# Patient Record
Sex: Female | Born: 1960
Health system: Southern US, Community
[De-identification: ages and names within clinical notes are randomized; demographics above are authoritative.]

## PROBLEM LIST (undated history)

## (undated) DIAGNOSIS — E7800 Pure hypercholesterolemia, unspecified: Secondary | ICD-10-CM

## (undated) DIAGNOSIS — M1990 Unspecified osteoarthritis, unspecified site: Secondary | ICD-10-CM

## (undated) HISTORY — PX: ABDOMINAL HYSTERECTOMY: SHX81

## (undated) HISTORY — PX: BREAST BIOPSY: SHX20

---

## 2005-05-23 ENCOUNTER — Ambulatory Visit: Payer: Self-pay

## 2006-07-02 ENCOUNTER — Ambulatory Visit: Payer: Self-pay

## 2008-08-02 ENCOUNTER — Ambulatory Visit: Payer: Self-pay

## 2009-03-24 ENCOUNTER — Emergency Department: Payer: Self-pay | Admitting: Emergency Medicine

## 2009-03-27 ENCOUNTER — Ambulatory Visit: Payer: Self-pay | Admitting: General Surgery

## 2009-07-27 ENCOUNTER — Ambulatory Visit: Payer: Self-pay | Admitting: Internal Medicine

## 2010-09-18 ENCOUNTER — Ambulatory Visit: Payer: Self-pay | Admitting: Family Medicine

## 2010-09-22 ENCOUNTER — Encounter: Payer: Self-pay | Admitting: Neurosurgery

## 2011-03-12 ENCOUNTER — Ambulatory Visit: Payer: Self-pay

## 2011-03-14 ENCOUNTER — Ambulatory Visit: Payer: Self-pay

## 2011-04-07 ENCOUNTER — Ambulatory Visit: Payer: Self-pay | Admitting: General Surgery

## 2011-06-11 ENCOUNTER — Ambulatory Visit: Payer: Self-pay

## 2011-11-13 ENCOUNTER — Ambulatory Visit: Payer: Self-pay | Admitting: General Surgery

## 2011-12-19 ENCOUNTER — Ambulatory Visit: Payer: Self-pay | Admitting: General Surgery

## 2011-12-23 LAB — PATHOLOGY REPORT

## 2012-10-29 ENCOUNTER — Other Ambulatory Visit: Payer: Self-pay

## 2012-10-29 LAB — CBC WITH DIFFERENTIAL/PLATELET
Basophil #: 0.1 10*3/uL (ref 0.0–0.1)
Basophil %: 1 %
Eosinophil #: 0.4 10*3/uL (ref 0.0–0.7)
HGB: 13.4 g/dL (ref 12.0–16.0)
Lymphocyte #: 2.7 10*3/uL (ref 1.0–3.6)
Lymphocyte %: 34.8 %
MCV: 89 fL (ref 80–100)
Monocyte #: 0.5 x10 3/mm (ref 0.2–0.9)
Monocyte %: 6.3 %
Neutrophil #: 4.1 10*3/uL (ref 1.4–6.5)
Neutrophil %: 53.3 %
RBC: 4.46 10*6/uL (ref 3.80–5.20)

## 2013-04-11 ENCOUNTER — Ambulatory Visit: Payer: Self-pay

## 2013-06-18 ENCOUNTER — Emergency Department: Payer: Self-pay | Admitting: Internal Medicine

## 2014-05-11 ENCOUNTER — Ambulatory Visit: Payer: Self-pay

## 2014-05-16 ENCOUNTER — Ambulatory Visit: Payer: Self-pay

## 2014-07-04 ENCOUNTER — Ambulatory Visit: Payer: Self-pay | Admitting: Physician Assistant

## 2014-07-04 LAB — OCCULT BLOOD X 1 CARD TO LAB, STOOL: Occult Blood, Feces: NEGATIVE

## 2014-07-04 LAB — CLOSTRIDIUM DIFFICILE(ARMC)

## 2014-07-06 LAB — STOOL CULTURE

## 2015-06-15 ENCOUNTER — Other Ambulatory Visit
Admit: 2015-06-15 | Discharge: 2015-06-15 | Disposition: A | Discharge status: Home / Self Care | Payer: 59 | Attending: Nurse Practitioner | Admitting: Nurse Practitioner

## 2015-06-15 DIAGNOSIS — Z0000 Encounter for general adult medical examination without abnormal findings: Secondary | ICD-10-CM | POA: Insufficient documentation

## 2015-06-15 LAB — COMPREHENSIVE METABOLIC PANEL WITH GFR
ALT: 24 U/L (ref 14–54)
AST: 21 U/L (ref 15–41)
Albumin: 4 g/dL (ref 3.5–5.0)
Alkaline Phosphatase: 97 U/L (ref 38–126)
Anion gap: 8 (ref 5–15)
BUN: 25 mg/dL — ABNORMAL HIGH (ref 6–20)
CO2: 26 mmol/L (ref 22–32)
Calcium: 9.3 mg/dL (ref 8.9–10.3)
Chloride: 106 mmol/L (ref 101–111)
Creatinine, Ser: 0.59 mg/dL (ref 0.44–1.00)
GFR calc Af Amer: >60 mL/min
GFR calc non Af Amer: >60 mL/min
Glucose, Bld: 87 mg/dL (ref 65–99)
Potassium: 3.7 mmol/L (ref 3.5–5.1)
Sodium: 140 mmol/L (ref 135–145)
Total Bilirubin: 0.4 mg/dL (ref 0.3–1.2)
Total Protein: 7.7 g/dL (ref 6.5–8.1)

## 2015-06-15 LAB — LIPID PANEL
Cholesterol: 246 mg/dL — ABNORMAL HIGH (ref 0–200)
HDL: 32 mg/dL — ABNORMAL LOW
LDL Cholesterol: UNDETERMINED mg/dL (ref 0–99)
Total CHOL/HDL Ratio: 7.7 ratio
Triglycerides: 734 mg/dL — ABNORMAL HIGH
VLDL: UNDETERMINED mg/dL (ref 0–40)

## 2015-06-15 LAB — CBC WITH DIFFERENTIAL/PLATELET
HEMATOCRIT: 39.7 % (ref 35.0–47.0)
HEMOGLOBIN: 13.2 g/dL (ref 12.0–16.0)
MCH: 29.8 pg (ref 26.0–34.0)
MCHC: 33.3 g/dL (ref 32.0–36.0)
MCV: 89.5 fL (ref 80.0–100.0)
Platelets: 264 10*3/uL (ref 150–440)
RBC: 4.44 MIL/uL (ref 3.80–5.20)
RDW: 15.7 % — AB (ref 11.5–14.5)
WBC: 9.1 10*3/uL (ref 3.6–11.0)

## 2015-06-15 LAB — T4, FREE: Free T4: 0.77 ng/dL (ref 0.61–1.12)

## 2015-06-15 LAB — TSH: TSH: 1.928 u[IU]/mL (ref 0.350–4.500)

## 2015-06-16 LAB — VITAMIN D 25 HYDROXY (VIT D DEFICIENCY, FRACTURES): Vit D, 25-Hydroxy: 20.3 ng/mL — ABNORMAL LOW (ref 30.0–100.0)

## 2015-06-16 LAB — T3: T3, Total: 130 ng/dL (ref 71–180)

## 2015-06-18 ENCOUNTER — Other Ambulatory Visit: Payer: Self-pay | Admitting: Internal Medicine

## 2015-06-18 DIAGNOSIS — Z1231 Encounter for screening mammogram for malignant neoplasm of breast: Secondary | ICD-10-CM

## 2015-06-18 DIAGNOSIS — E2839 Other primary ovarian failure: Secondary | ICD-10-CM

## 2015-06-19 ENCOUNTER — Other Ambulatory Visit: Payer: Self-pay | Admitting: Internal Medicine

## 2015-06-19 DIAGNOSIS — R920 Mammographic microcalcification found on diagnostic imaging of breast: Secondary | ICD-10-CM

## 2015-07-12 ENCOUNTER — Ambulatory Visit
Admission: RE | Admit: 2015-07-12 | Discharge: 2015-07-12 | Disposition: A | Discharge status: Home / Self Care | Payer: 59 | Source: Ambulatory Visit | Attending: Internal Medicine | Admitting: Internal Medicine

## 2015-07-12 DIAGNOSIS — R920 Mammographic microcalcification found on diagnostic imaging of breast: Secondary | ICD-10-CM | POA: Insufficient documentation

## 2015-07-12 DIAGNOSIS — Z780 Asymptomatic menopausal state: Secondary | ICD-10-CM | POA: Diagnosis not present

## 2015-07-12 DIAGNOSIS — E2839 Other primary ovarian failure: Secondary | ICD-10-CM | POA: Insufficient documentation

## 2015-09-11 DIAGNOSIS — G5601 Carpal tunnel syndrome, right upper limb: Secondary | ICD-10-CM | POA: Diagnosis not present

## 2015-09-11 DIAGNOSIS — M0579 Rheumatoid arthritis with rheumatoid factor of multiple sites without organ or systems involvement: Secondary | ICD-10-CM | POA: Diagnosis not present

## 2015-10-04 DIAGNOSIS — F411 Generalized anxiety disorder: Secondary | ICD-10-CM | POA: Diagnosis not present

## 2015-10-04 DIAGNOSIS — F17210 Nicotine dependence, cigarettes, uncomplicated: Secondary | ICD-10-CM | POA: Diagnosis not present

## 2015-10-04 DIAGNOSIS — E785 Hyperlipidemia, unspecified: Secondary | ICD-10-CM | POA: Diagnosis not present

## 2015-10-04 DIAGNOSIS — K219 Gastro-esophageal reflux disease without esophagitis: Secondary | ICD-10-CM | POA: Diagnosis not present

## 2015-10-04 DIAGNOSIS — Z0001 Encounter for general adult medical examination with abnormal findings: Secondary | ICD-10-CM | POA: Diagnosis not present

## 2015-10-19 DIAGNOSIS — M0579 Rheumatoid arthritis with rheumatoid factor of multiple sites without organ or systems involvement: Secondary | ICD-10-CM | POA: Diagnosis not present

## 2015-10-26 DIAGNOSIS — M0579 Rheumatoid arthritis with rheumatoid factor of multiple sites without organ or systems involvement: Secondary | ICD-10-CM | POA: Diagnosis not present

## 2015-10-26 DIAGNOSIS — G5601 Carpal tunnel syndrome, right upper limb: Secondary | ICD-10-CM | POA: Diagnosis not present

## 2015-10-26 DIAGNOSIS — Z79899 Other long term (current) drug therapy: Secondary | ICD-10-CM | POA: Diagnosis not present

## 2015-10-31 DIAGNOSIS — G5601 Carpal tunnel syndrome, right upper limb: Secondary | ICD-10-CM | POA: Diagnosis not present

## 2016-01-07 DIAGNOSIS — G5601 Carpal tunnel syndrome, right upper limb: Secondary | ICD-10-CM | POA: Diagnosis not present

## 2016-02-04 DIAGNOSIS — E785 Hyperlipidemia, unspecified: Secondary | ICD-10-CM | POA: Diagnosis not present

## 2016-02-04 DIAGNOSIS — G4700 Insomnia, unspecified: Secondary | ICD-10-CM | POA: Diagnosis not present

## 2016-02-04 DIAGNOSIS — J069 Acute upper respiratory infection, unspecified: Secondary | ICD-10-CM | POA: Diagnosis not present

## 2016-02-04 DIAGNOSIS — F411 Generalized anxiety disorder: Secondary | ICD-10-CM | POA: Diagnosis not present

## 2016-04-24 DIAGNOSIS — M0579 Rheumatoid arthritis with rheumatoid factor of multiple sites without organ or systems involvement: Secondary | ICD-10-CM | POA: Diagnosis not present

## 2016-04-24 DIAGNOSIS — G5603 Carpal tunnel syndrome, bilateral upper limbs: Secondary | ICD-10-CM | POA: Diagnosis not present

## 2016-06-12 ENCOUNTER — Ambulatory Visit: Payer: Self-pay | Admitting: Physician Assistant

## 2016-06-12 ENCOUNTER — Encounter: Payer: Self-pay | Admitting: Physician Assistant

## 2016-06-12 VITALS — BP 160/100 | HR 80 | Temp 98.5°F

## 2016-06-12 DIAGNOSIS — R030 Elevated blood-pressure reading, without diagnosis of hypertension: Secondary | ICD-10-CM

## 2016-06-12 DIAGNOSIS — J069 Acute upper respiratory infection, unspecified: Secondary | ICD-10-CM

## 2016-06-12 MED ORDER — AZITHROMYCIN 250 MG PO TABS: ORAL_TABLET | ORAL | 0 refills | Status: DC

## 2016-06-12 MED ORDER — BENZONATATE 200 MG PO CAPS: 200.0000 mg | ORAL_CAPSULE | Freq: Two times a day (BID) | ORAL | 0 refills | Status: DC | PRN

## 2016-06-12 NOTE — Progress Notes (Signed)
S: C/o runny nose and congestion for 10 days, no fever, chills, cp/sob, v/d; mucus was green this am but clear throughout the day, cough is sporadic, has a lot of head congestion  Using otc meds: robitussin, delsym, dayquil and nyquil  O: PE: vitals wnl, nad, perrl eomi, normocephalic, tms dull, nasal mucosa red and swollen, throat injected, neck supple no lymph, lungs c t a, cv rrr, neuro intact  A:  Acute uri, elevated bp   P: drink fluids, continue regular meds , use otc meds of choice, return if not improving in 5 days, return earlier if worsening ,zpack, tessalon perls, recheck bp after being off of dayquil and nyquil for a few days, if still elevated return to clinic, smoking cessation discussed

## 2016-09-03 DIAGNOSIS — D509 Iron deficiency anemia, unspecified: Secondary | ICD-10-CM | POA: Diagnosis not present

## 2016-09-03 DIAGNOSIS — I10 Essential (primary) hypertension: Secondary | ICD-10-CM | POA: Diagnosis not present

## 2016-09-03 DIAGNOSIS — F411 Generalized anxiety disorder: Secondary | ICD-10-CM | POA: Diagnosis not present

## 2016-09-03 DIAGNOSIS — E782 Mixed hyperlipidemia: Secondary | ICD-10-CM | POA: Diagnosis not present

## 2016-09-03 DIAGNOSIS — F17210 Nicotine dependence, cigarettes, uncomplicated: Secondary | ICD-10-CM | POA: Diagnosis not present

## 2016-09-03 DIAGNOSIS — E559 Vitamin D deficiency, unspecified: Secondary | ICD-10-CM | POA: Diagnosis not present

## 2016-09-03 DIAGNOSIS — J019 Acute sinusitis, unspecified: Secondary | ICD-10-CM | POA: Diagnosis not present

## 2016-12-16 DIAGNOSIS — H524 Presbyopia: Secondary | ICD-10-CM | POA: Diagnosis not present

## 2017-01-07 DIAGNOSIS — Z0001 Encounter for general adult medical examination with abnormal findings: Secondary | ICD-10-CM | POA: Diagnosis not present

## 2017-01-07 DIAGNOSIS — F17210 Nicotine dependence, cigarettes, uncomplicated: Secondary | ICD-10-CM | POA: Diagnosis not present

## 2017-01-07 DIAGNOSIS — F411 Generalized anxiety disorder: Secondary | ICD-10-CM | POA: Diagnosis not present

## 2017-01-07 DIAGNOSIS — E785 Hyperlipidemia, unspecified: Secondary | ICD-10-CM | POA: Diagnosis not present

## 2017-01-15 DIAGNOSIS — M0579 Rheumatoid arthritis with rheumatoid factor of multiple sites without organ or systems involvement: Secondary | ICD-10-CM | POA: Diagnosis not present

## 2017-01-19 ENCOUNTER — Other Ambulatory Visit: Payer: Self-pay | Admitting: Nurse Practitioner

## 2017-01-19 DIAGNOSIS — R921 Mammographic calcification found on diagnostic imaging of breast: Secondary | ICD-10-CM

## 2017-01-19 DIAGNOSIS — G5603 Carpal tunnel syndrome, bilateral upper limbs: Secondary | ICD-10-CM | POA: Diagnosis not present

## 2017-01-19 DIAGNOSIS — M0579 Rheumatoid arthritis with rheumatoid factor of multiple sites without organ or systems involvement: Secondary | ICD-10-CM | POA: Diagnosis not present

## 2017-02-19 ENCOUNTER — Ambulatory Visit
Admission: RE | Admit: 2017-02-19 | Discharge: 2017-02-19 | Disposition: A | Discharge status: Home / Self Care | Payer: 59 | Source: Ambulatory Visit | Attending: Nurse Practitioner | Admitting: Nurse Practitioner

## 2017-02-19 ENCOUNTER — Other Ambulatory Visit: Payer: Self-pay | Admitting: Nurse Practitioner

## 2017-02-19 DIAGNOSIS — R921 Mammographic calcification found on diagnostic imaging of breast: Secondary | ICD-10-CM | POA: Diagnosis not present

## 2017-02-19 DIAGNOSIS — N6489 Other specified disorders of breast: Secondary | ICD-10-CM | POA: Diagnosis not present

## 2017-06-22 ENCOUNTER — Encounter: Payer: Self-pay | Admitting: Physician Assistant

## 2017-06-22 ENCOUNTER — Ambulatory Visit: Payer: Self-pay | Admitting: Physician Assistant

## 2017-06-22 VITALS — BP 142/90 | HR 98 | Temp 98.2°F

## 2017-06-22 DIAGNOSIS — J209 Acute bronchitis, unspecified: Secondary | ICD-10-CM

## 2017-06-22 MED ORDER — METHYLPREDNISOLONE 4 MG PO TBPK: ORAL_TABLET | ORAL | 0 refills | Status: DC

## 2017-06-22 MED ORDER — AZITHROMYCIN 250 MG PO TABS: ORAL_TABLET | ORAL | 0 refills | Status: DC

## 2017-06-22 MED ORDER — ALBUTEROL SULFATE HFA 108 (90 BASE) MCG/ACT IN AERS: 2.0000 | INHALATION_SPRAY | Freq: Four times a day (QID) | RESPIRATORY_TRACT | 0 refills | Status: DC | PRN

## 2017-06-22 NOTE — Progress Notes (Signed)
S: C/o cough and congestion with wheezing and chest pain, chest is sore from coughing, denies fever, chills, mostly cough is dry and hacking; keeping pt awake at night;  denies cardiac type chest pain or sob, v/d, abd pain, sx for about a week Remainder ros neg  O: vitals wnl, nad, tms clear, throat injected, neck supple no lymph, lungs c t a, cv rrr, neuro intact  A:  Acute bronchitis   P:  rx medication:  Zpack, albuterol, medrol dose pack; smoking cessation, use otc meds, tylenol or motrin as needed for fever/chills, return if not better in 3 -5 days, return earlier if worsening

## 2017-07-06 DIAGNOSIS — G4700 Insomnia, unspecified: Secondary | ICD-10-CM | POA: Diagnosis not present

## 2017-07-06 DIAGNOSIS — F17210 Nicotine dependence, cigarettes, uncomplicated: Secondary | ICD-10-CM | POA: Diagnosis not present

## 2017-07-06 DIAGNOSIS — F411 Generalized anxiety disorder: Secondary | ICD-10-CM | POA: Diagnosis not present

## 2017-07-06 DIAGNOSIS — E785 Hyperlipidemia, unspecified: Secondary | ICD-10-CM | POA: Diagnosis not present

## 2017-07-06 DIAGNOSIS — R030 Elevated blood-pressure reading, without diagnosis of hypertension: Secondary | ICD-10-CM | POA: Diagnosis not present

## 2017-07-20 DIAGNOSIS — M0579 Rheumatoid arthritis with rheumatoid factor of multiple sites without organ or systems involvement: Secondary | ICD-10-CM | POA: Diagnosis not present

## 2017-09-08 ENCOUNTER — Other Ambulatory Visit: Payer: Self-pay

## 2017-09-08 MED ORDER — OMEPRAZOLE 20 MG PO CPDR: 20.0000 mg | DELAYED_RELEASE_CAPSULE | Freq: Every day | ORAL | 6 refills | Status: DC

## 2017-10-05 ENCOUNTER — Other Ambulatory Visit: Payer: Self-pay

## 2017-10-05 MED ORDER — ROSUVASTATIN CALCIUM 5 MG PO TABS: 5.0000 mg | ORAL_TABLET | Freq: Every day | ORAL | 5 refills | Status: DC

## 2017-10-06 ENCOUNTER — Other Ambulatory Visit: Payer: Self-pay | Admitting: Nurse Practitioner

## 2017-10-06 ENCOUNTER — Other Ambulatory Visit: Payer: Self-pay

## 2017-10-06 DIAGNOSIS — F331 Major depressive disorder, recurrent, moderate: Secondary | ICD-10-CM

## 2017-10-06 MED ORDER — DESVENLAFAXINE ER 50 MG PO TB24
150.0000 mg | ORAL_TABLET | Freq: Every day | ORAL | 5 refills | Status: DC
Start: 2017-10-06 — End: 2018-07-20

## 2017-10-06 NOTE — Progress Notes (Signed)
Patient should be taking pristiq 150mg  daily. Sent in rx as 50mg  tablets, taking 3 tablets daily.

## 2017-10-09 ENCOUNTER — Other Ambulatory Visit: Payer: Self-pay

## 2017-10-09 ENCOUNTER — Encounter: Payer: Self-pay | Admitting: Emergency Medicine

## 2017-10-09 ENCOUNTER — Ambulatory Visit
Admission: EM | Admit: 2017-10-09 | Discharge: 2017-10-09 | Disposition: A | Discharge status: Home / Self Care | Payer: No Typology Code available for payment source | Attending: Family Medicine | Admitting: Family Medicine

## 2017-10-09 DIAGNOSIS — R05 Cough: Secondary | ICD-10-CM

## 2017-10-09 DIAGNOSIS — R059 Cough, unspecified: Secondary | ICD-10-CM

## 2017-10-09 DIAGNOSIS — J9801 Acute bronchospasm: Secondary | ICD-10-CM | POA: Diagnosis not present

## 2017-10-09 MED ORDER — AZITHROMYCIN 250 MG PO TABS: ORAL_TABLET | ORAL | 0 refills | Status: DC

## 2017-10-09 MED ORDER — IPRATROPIUM-ALBUTEROL 0.5-2.5 (3) MG/3ML IN SOLN
3.0000 mL | Freq: Once | RESPIRATORY_TRACT | Status: AC
  Administered 2017-10-09: 3 mL via RESPIRATORY_TRACT

## 2017-10-09 MED ORDER — PREDNISONE 20 MG PO TABS: ORAL_TABLET | ORAL | 0 refills | Status: DC

## 2017-10-09 NOTE — ED Triage Notes (Signed)
Patient reports Saturday and Sunday having fever.  Patient reports c/o cough, chest congestion and SOB for the past 5 days.

## 2017-10-09 NOTE — ED Provider Notes (Signed)
[facility]    CSN: [id number] Arrival date & time: 10/09/17  0808     History   Chief Complaint Chief Complaint  Patient presents with  . Cough    HPI Karen Castro is a 57 y.o. female.   The history is provided by the patient.  Cough  Associated symptoms: fever, rhinorrhea and wheezing   URI  Presenting symptoms: congestion, cough, fatigue, fever and rhinorrhea   Severity:  Moderate Onset quality:  Sudden Duration:  7 days Timing:  Constant Progression:  Worsening Chronicity:  New Relieved by:  Nothing Ineffective treatments:  Prescription medications Associated symptoms: wheezing   Risk factors: chronic respiratory disease, immunosuppression and sick contacts   Risk factors: not elderly, no chronic cardiac disease, no chronic kidney disease, no diabetes mellitus, no recent illness and no recent travel     History reviewed. No pertinent past medical history.  There are no active problems to display for this patient.   Past Surgical History:  Procedure Laterality Date  . ABDOMINAL HYSTERECTOMY    . BREAST BIOPSY Right    core- neg    OB History    No data available       Home Medications    Prior to Admission medications   Medication Sig Start Date End Date Taking? Authorizing Provider  albuterol (PROVENTIL HFA;VENTOLIN HFA) 108 (90 Base) MCG/ACT inhaler Inhale 2 puffs into the lungs every 6 (six) hours as needed for wheezing or shortness of breath. 06/22/17  Yes Fisher, Roselyn Bering, PA-C  cetirizine (ZYRTEC) 10 MG tablet Take 10 mg by mouth daily.   Yes [provider]  Desvenlafaxine ER (PRISTIQ) 50 MG TB24 Take 3 tablets (150 mg total) by mouth daily. 10/06/17  Yes Carlean Jews, NP  folic acid (FOLVITE) 1 MG tablet Take by mouth. 01/19/17  Yes [provider]  methotrexate (RHEUMATREX) 2.5 MG tablet  03/10/16  Yes [provider]  omeprazole (PRILOSEC) 20 MG capsule Take 1 capsule (20 mg total) by mouth daily.  09/08/17  Yes Boscia, Kathlynn Grate, NP  rosuvastatin (CRESTOR) 5 MG tablet Take 1 tablet (5 mg total) by mouth daily with supper. 10/05/17  Yes Boscia, Kathlynn Grate, NP  azithromycin (ZITHROMAX Z-PAK) 250 MG tablet 2 tabs po once day 1, then 1 tab po qd for next 4 days 10/09/17   Payton Mccallum, MD  benzonatate (TESSALON) 200 MG capsule Take 1 capsule (200 mg total) by mouth 2 (two) times daily as needed for cough. Patient not taking: Reported on 06/22/2017 06/12/16   Faythe Ghee, PA-C  methylPREDNISolone (MEDROL DOSEPAK) 4 MG TBPK tablet Take 6 pills on day one then decrease by 1 pill each day 06/22/17   Faythe Ghee, PA-C  predniSONE (DELTASONE) 20 MG tablet 3 tabs po qd x 2 days, then 2 tabs po qd x 3 days, then 1 tab po qd x 3 days, then half a tab po qd x 2 days. 10/09/17   Payton Mccallum, MD    Family History Family History  Problem Relation Age of Onset  . Breast cancer Maternal Grandmother 64    Social History Social History   Tobacco Use  . Smoking status: Current Every Day Smoker    Types: Cigarettes  . Smokeless tobacco: Never Used  Substance Use Topics  . Alcohol use: Yes  . Drug use: No     Allergies   Patient has no known allergies.   Review of Systems Review of Systems  Constitutional: Positive for fatigue and fever.  HENT: Positive for congestion and rhinorrhea.   Respiratory: Positive for cough and wheezing.      Physical Exam Triage Vital Signs ED Triage Vitals  Enc Vitals Group     BP 10/09/17 0826 (!) 167/95     Pulse Rate 10/09/17 0826 85     Resp 10/09/17 0826 18     Temp 10/09/17 0826 97.9 F (36.6 C)     Temp Source 10/09/17 0826 Oral     SpO2 10/09/17 0826 98 %     Weight 10/09/17 0822 230 lb (104.3 kg)     Height 10/09/17 0822 5\' 8"  (1.727 m)     Head Circumference --      Peak Flow --      Pain Score 10/09/17 0821 0     Pain Loc --      Pain Edu? --      Excl. in GC? --    No data found.  Updated Vital Signs BP (!) 167/95 (BP  Location: Left Arm)   Pulse 78   Temp 97.9 F (36.6 C) (Oral)   Resp 18   Ht 5\' 8"  (1.727 m)   Wt 230 lb (104.3 kg)   SpO2 98%   BMI 34.97 kg/m   Visual Acuity Right Eye Distance:   Left Eye Distance:   Bilateral Distance:    Right Eye Near:   Left Eye Near:    Bilateral Near:     Physical Exam  Constitutional: She appears well-developed and well-nourished. No distress.  HENT:  Head: Normocephalic and atraumatic.  Right Ear: Tympanic membrane, external ear and ear canal normal.  Left Ear: Tympanic membrane, external ear and ear canal normal.  Nose: Rhinorrhea present. No mucosal edema, nose lacerations, sinus tenderness, nasal deformity, septal deviation or nasal septal hematoma. No epistaxis.  No foreign bodies. Right sinus exhibits no maxillary sinus tenderness and no frontal sinus tenderness. Left sinus exhibits no maxillary sinus tenderness and no frontal sinus tenderness.  Mouth/Throat: Uvula is midline, oropharynx is clear and moist and mucous membranes are normal. No oropharyngeal exudate.  Eyes: Conjunctivae and EOM are normal. Pupils are equal, round, and reactive to light. Right eye exhibits no discharge. Left eye exhibits no discharge. No scleral icterus.  Neck: Normal range of motion. Neck supple. No thyromegaly present.  Cardiovascular: Normal rate, regular rhythm and normal heart sounds.  Pulmonary/Chest: Effort normal. No stridor. No respiratory distress. She has wheezes (diffusely and rhonchi). She has no rales.  Lymphadenopathy:    She has no cervical adenopathy.  Skin: She is not diaphoretic.  Nursing note and vitals reviewed.    UC Treatments / Results  Labs (all labs ordered are listed, but only abnormal results are displayed) Labs Reviewed - No data to display  EKG  EKG Interpretation None       Radiology No results found.  Procedures Procedures (including critical care time)  Medications Ordered in UC Medications  ipratropium-albuterol  (DUONEB) 0.5-2.5 (3) MG/3ML nebulizer solution 3 mL (3 mLs Nebulization Given 10/09/17 0847)     Initial Impression / Assessment and Plan / UC Course  I have reviewed the triage vital signs and the nursing notes.  Pertinent labs & imaging results that were available during my care of the patient were reviewed by me and considered in my medical decision making (see chart for details).       Final Clinical Impressions(s) / UC Diagnoses   Final diagnoses:  Cough  Bronchospasm    ED Discharge Orders        Ordered    azithromycin (ZITHROMAX Z-PAK) 250 MG tablet     10/09/17 0902    predniSONE (DELTASONE) 20 MG tablet     10/09/17 0902     1. diagnosis reviewed with patient; given duoneb tx in clinic 2. rx as per orders above; reviewed possible side effects, interactions, risks and benefits  3. Recommend supportive treatment with rest, fluids; continue current home inhaler 4. Follow-up prn if symptoms worsen or don't improve  Controlled Substance Prescriptions [place] Controlled Substance Registry consulted? Not Applicable   Payton Mccallum, MD 10/09/17 [id number]

## 2017-10-12 ENCOUNTER — Telehealth: Payer: Self-pay

## 2017-10-12 NOTE — Telephone Encounter (Signed)
Called to follow up with patient since visit here at [facility]. Patient instructed to call back with any questions or concerns. MAH  

## 2017-11-27 ENCOUNTER — Ambulatory Visit (INDEPENDENT_AMBULATORY_CARE_PROVIDER_SITE_OTHER): Payer: No Typology Code available for payment source

## 2017-11-27 ENCOUNTER — Ambulatory Visit
Admission: EM | Admit: 2017-11-27 | Discharge: 2017-11-27 | Disposition: A | Discharge status: Home / Self Care | Payer: No Typology Code available for payment source | Attending: Emergency Medicine | Admitting: Emergency Medicine

## 2017-11-27 ENCOUNTER — Other Ambulatory Visit: Payer: Self-pay

## 2017-11-27 ENCOUNTER — Encounter: Payer: Self-pay | Admitting: *Deleted

## 2017-11-27 DIAGNOSIS — W010XXA Fall on same level from slipping, tripping and stumbling without subsequent striking against object, initial encounter: Secondary | ICD-10-CM | POA: Diagnosis not present

## 2017-11-27 DIAGNOSIS — M25421 Effusion, right elbow: Secondary | ICD-10-CM | POA: Diagnosis not present

## 2017-11-27 DIAGNOSIS — M25521 Pain in right elbow: Secondary | ICD-10-CM | POA: Diagnosis not present

## 2017-11-27 HISTORY — DX: Pure hypercholesterolemia, unspecified: E78.00

## 2017-11-27 HISTORY — DX: Unspecified osteoarthritis, unspecified site: M19.90

## 2017-11-27 MED ORDER — OXYCODONE-ACETAMINOPHEN 5-325 MG PO TABS: 1.0000 | ORAL_TABLET | Freq: Three times a day (TID) | ORAL | 0 refills | Status: DC | PRN

## 2017-11-27 NOTE — ED Provider Notes (Signed)
[facility] ____________________________________________  Time seen: Approximately 8:49 PM  I have reviewed the triage vital signs and the nursing notes.   HISTORY  Chief Complaint Arm Injury   HPI Karen Castro is a 57 y.o. female presenting for evaluation of right elbow pain post mechanical injury that occurred approximately 2 hours prior to arrival.  Patient reports that they were helping get things together for a yard sale, turned around when her husband called her name, and tripped over a box of books causing her to fall forward.  States that she did hit the palm of her hand and unsure if she tried to catch herself with her right arm.  States right elbow pain since injury.  States that she does have an abrasion to her right knee, denies need for tetanus immunization and denies other pain to the knee.  Also reports did hit her glasses and slightly hit left face but denies loss of consciousness or other pain.  Denies facial pain. Denies headache, vision changes, dizziness.  Presenting for right elbow pain.  Reports right elbow pain has been constant since injury.  Described currently as minimal but worse with direct palpation and attempt at extending.  States unable to fully extend arm.  Reports right-hand dominant.  Did take Advil prior to arrival.  No other alleviating measures attempted.  Denies paresthesias.  Some pain radiation from elbow up and down arm, none constant.   Denies chest pain, shortness of breath, abdominal pain, headache, neck or back pain. Denies recent sickness.  Lyndon Code, MD: PCP   Past Medical History:  Diagnosis Date  . Arthritis   . Hypercholesteremia   RA (follows with [facility])    There are no active problems to display for this patient.   Past Surgical History:  Procedure Laterality Date  . ABDOMINAL HYSTERECTOMY    . BREAST BIOPSY Right    core- neg     No current facility-administered medications for this encounter.    Current Outpatient Medications:  .  albuterol (PROVENTIL HFA;VENTOLIN HFA) 108 (90 Base) MCG/ACT inhaler, Inhale 2 puffs into the lungs every 6 (six) hours as needed for wheezing or shortness of breath., Disp: 1 Inhaler, Rfl: 0 .  cetirizine (ZYRTEC) 10 MG tablet, Take 10 mg by mouth daily., Disp: , Rfl:  .  Desvenlafaxine ER (PRISTIQ) 50 MG TB24, Take 3 tablets (150 mg total) by mouth daily., Disp: 90 tablet, Rfl: 5 .  folic acid (FOLVITE) 1 MG tablet, Take by mouth., Disp: , Rfl:  .  methotrexate (RHEUMATREX) 2.5 MG tablet, , Disp: , Rfl: 6 .  omeprazole (PRILOSEC) 20 MG capsule, Take 1 capsule (20 mg total) by mouth daily., Disp: 30 capsule, Rfl: 6 .  rosuvastatin (CRESTOR) 5 MG tablet, Take 1 tablet (5 mg total) by mouth daily with supper., Disp: 30 tablet, Rfl: 5 .  oxyCODONE-acetaminophen (PERCOCET/ROXICET) 5-325 MG tablet, Take 1 tablet by mouth every 8 (eight) hours as needed for severe pain. Do not drive while taking as can cause drowsiness., Disp: 9 tablet, Rfl: 0  Allergies Patient has no known allergies.  Family History  Problem Relation Age of Onset  . Breast cancer Maternal Grandmother 79    Social History Social History   Tobacco Use  . Smoking status: Current Every Day Smoker    Types: Cigarettes  . Smokeless tobacco: Never Used  Substance Use Topics  . Alcohol use: Yes  . Drug use: No    Review of Systems Eyes: No  visual changes. Cardiovascular: Denies chest pain. Respiratory: Denies shortness of breath. Gastrointestinal: No abdominal pain.   Musculoskeletal: Negative for back pain. As above.  Skin: Negative for rash. Neurological: Negative for headaches, focal weakness or numbness. .  ____________________________________________   PHYSICAL EXAM:  VITAL SIGNS: ED Triage Vitals  Enc Vitals Group     BP 11/27/17 1930 (!) 177/80     Pulse Rate 11/27/17 1930 88     Resp 11/27/17 1930 16     Temp 11/27/17 1930 98.4 F (36.9 C)     Temp Source  11/27/17 1930 Oral     SpO2 11/27/17 1930 96 %     Weight --      Height --      Head Circumference --      Peak Flow --      Pain Score 11/27/17 1933 2     Pain Loc --      Pain Edu? --      Excl. in GC? --     Constitutional: Alert and oriented. Well appearing and in no acute distress. Eyes: Conjunctivae are normal.  ENT      Head: Normocephalic and atraumatic. Cardiovascular: Normal rate, regular rhythm. Grossly normal heart sounds.  Good peripheral circulation. Respiratory: Normal respiratory effort without tachypnea nor retractions. Breath sounds are clear and equal bilaterally. No wheezes, rales, rhonchi. Musculoskeletal: No midline cervical, thoracic or lumbar tenderness to palpation. Bilateral distal radial pulses equal and easily palpated. Except: right lateral epicondyle and proximal radial head mild to moderate tenderness to direct palpation with minimal swelling, unable to fully extend elbow, able to flex past 90 degrees,pain with pronation and supination, unable fully supinate, right upper extremity otherwise nontender.  Full range of motion present to right elbow and wrist and otherwise nontender.  Slightly weakened right hand grip versus left, but with normal right hand distal sensation and cap refill. Neurologic:  Normal speech and language. No gross focal neurologic deficits are appreciated. Speech is normal. No gait instability.  Skin:  Skin is warm, dry. Psychiatric: Mood and affect are normal. Speech and behavior are normal. Patient exhibits appropriate insight and judgment   ___________________________________________   LABS (all labs ordered are listed, but only abnormal results are displayed)  Labs Reviewed - No data to display ____________________________________________  RADIOLOGY  Dg Elbow Complete Right  Addendum Date: 11/27/2017   ADDENDUM REPORT: 11/27/2017 20:41 ADDENDUM: There is a posterior fat pad without a definitive fracture. If there is high  clinical suspicion for an occult fracture, recommend a follow-up x-ray of the right elbow in 7-10 days. Electronically Signed   By: Elige Ko   On: 11/27/2017 20:41   Result Date: 11/27/2017 CLINICAL DATA:  Status post fall.  Pain. EXAM: RIGHT ELBOW - COMPLETE 3+ VIEW COMPARISON:  None. FINDINGS: There is no evidence of fracture, dislocation, or joint effusion. There is no evidence of arthropathy or other focal bone abnormality. Soft tissues are unremarkable. IMPRESSION: Negative. Electronically Signed: By: Elige Ko On: 11/27/2017 20:21   ____________________________________________   PROCEDURES Procedures    INITIAL IMPRESSION / ASSESSMENT AND PLAN / ED COURSE  Pertinent labs & imaging results that were available during my care of the patient were reviewed by me and considered in my medical decision making (see chart for details).  Well-appearing patient.  No acute distress.  No focal neurological deficits.  Presenting for right elbow pain post mechanical injury that occurred present 2 hours prior to arrival.  Patient unable to  fully extend right elbow and with mechanism of injury, concern for fracture.  Right elbow x-ray ordered and reviewed reviewed by myself as well as radiologist.  Called and discussed with radiologist as well, due to concern of posterior into anterior fat pad present.  Concern for radial head fracture with associated fat pad.  Posterior OCL splint and sling applied and recommend for repeat imaging in 1 week.  Follow-up with orthopedics, information given.  Rx # 9 Percocet given.  [place] controlled substance database reviewed and no conflicting controlled substances recently documented.  Encouraged rest, ice and supportive care.  Sling also given.Discussed indication, risks and benefits of medications with patient.  Discussed follow up with Primary care physician this week. Discussed follow up and return parameters including no resolution or any worsening  concerns. Patient verbalized understanding and agreed to plan.   ____________________________________________   FINAL CLINICAL IMPRESSION(S) / ED DIAGNOSES  Final diagnoses:  Right elbow pain  Effusion of right elbow     ED Discharge Orders        Ordered    oxyCODONE-acetaminophen (PERCOCET/ROXICET) 5-325 MG tablet  Every 8 hours PRN     11/27/17 2105       Note: This dictation was prepared with Dragon dictation along with smaller phrase technology. Any transcriptional errors that result from this process are unintentional.         Renford Dills, NP 11/27/17 2200

## 2017-11-27 NOTE — ED Triage Notes (Signed)
Patient fell this afternoon when she tripped over a box, Patient injured right arm /  elbow during the fall.

## 2017-11-27 NOTE — Discharge Instructions (Signed)
Take medication as prescribed. Rest. Drink plenty of fluids. Keep in splint and sling.   Follow up with orthopedic in one week as discussed.   Follow up with your primary care physician this week as needed. Return to Urgent care for new or worsening concerns.

## 2018-01-12 ENCOUNTER — Ambulatory Visit (INDEPENDENT_AMBULATORY_CARE_PROVIDER_SITE_OTHER): Payer: No Typology Code available for payment source | Admitting: Nurse Practitioner

## 2018-01-12 ENCOUNTER — Encounter: Payer: Self-pay | Admitting: Nurse Practitioner

## 2018-01-12 VITALS — BP 153/74 | HR 82 | Resp 16 | Ht 68.0 in | Wt 240.2 lb

## 2018-01-12 DIAGNOSIS — R300 Dysuria: Secondary | ICD-10-CM | POA: Diagnosis not present

## 2018-01-12 DIAGNOSIS — M069 Rheumatoid arthritis, unspecified: Secondary | ICD-10-CM | POA: Diagnosis not present

## 2018-01-12 DIAGNOSIS — Z124 Encounter for screening for malignant neoplasm of cervix: Secondary | ICD-10-CM

## 2018-01-12 DIAGNOSIS — Z0001 Encounter for general adult medical examination with abnormal findings: Secondary | ICD-10-CM | POA: Diagnosis not present

## 2018-01-12 DIAGNOSIS — J069 Acute upper respiratory infection, unspecified: Secondary | ICD-10-CM | POA: Diagnosis not present

## 2018-01-12 DIAGNOSIS — F3341 Major depressive disorder, recurrent, in partial remission: Secondary | ICD-10-CM

## 2018-01-12 DIAGNOSIS — E782 Mixed hyperlipidemia: Secondary | ICD-10-CM | POA: Diagnosis not present

## 2018-01-12 MED ORDER — AZITHROMYCIN 250 MG PO TABS: ORAL_TABLET | ORAL | 0 refills | Status: DC

## 2018-01-12 NOTE — Progress Notes (Signed)
[facility] [address] [place], [place] [address]  Internal MEDICINE  Office Visit Note  Patient Name: Karen Castro  [id number]  [id number]  Date of Service: 01/14/2018   Pt is here for routine health maintenance examination  Chief Complaint  Patient presents with  . Annual Exam  . Gynecologic Exam    6 month  . Allergies  . Ankle Pain     Reporting ankle pain and swelling for past few weeks. Has been worse since weather has been warm and humid.  Has had cough for past few months. Got worse after allergies started. She is a smoker and she is not ready to quit at this time.     Current Medication: Outpatient Encounter Medications as of 01/12/2018  Medication Sig Note  . albuterol (PROVENTIL HFA;VENTOLIN HFA) 108 (90 Base) MCG/ACT inhaler Inhale 2 puffs into the lungs every 6 (six) hours as needed for wheezing or shortness of breath.   . cetirizine (ZYRTEC) 10 MG tablet Take 10 mg by mouth daily.   Marland Kitchen Desvenlafaxine ER (PRISTIQ) 50 MG TB24 Take 3 tablets (150 mg total) by mouth daily.   . folic acid (FOLVITE) 1 MG tablet Take by mouth.   . methotrexate (RHEUMATREX) 2.5 MG tablet  06/12/2016: Received from: External Pharmacy  . omeprazole (PRILOSEC) 20 MG capsule Take 1 capsule (20 mg total) by mouth daily.   Marland Kitchen oxyCODONE-acetaminophen (PERCOCET/ROXICET) 5-325 MG tablet Take 1 tablet by mouth every 8 (eight) hours as needed for severe pain. Do not drive while taking as can cause drowsiness.   . rosuvastatin (CRESTOR) 5 MG tablet Take 1 tablet (5 mg total) by mouth daily with supper.   Marland Kitchen azithromycin (ZITHROMAX) 250 MG tablet z-pack - take as directed for 5 days    No facility-administered encounter medications on file as of 01/12/2018.     Surgical History: Past Surgical History:  Procedure Laterality Date  . ABDOMINAL HYSTERECTOMY    . BREAST BIOPSY Right    core- neg    Medical History: Past Medical History:  Diagnosis Date  . Arthritis   .  Hypercholesteremia     Family History: Family History  Problem Relation Age of Onset  . Breast cancer Maternal Grandmother 74      Review of Systems  Constitutional: Positive for fatigue. Negative for activity change, appetite change, chills and fever.  HENT: Positive for congestion, postnasal drip, rhinorrhea, sinus pain and sneezing. Negative for ear pain, sore throat and voice change.   Eyes: Negative.   Respiratory: Positive for cough and wheezing.   Cardiovascular: Negative for chest pain and palpitations.       Elevated blood pressure  Gastrointestinal: Negative for abdominal pain, constipation, diarrhea, nausea and vomiting.  Endocrine: Negative for cold intolerance, heat intolerance, polydipsia, polyphagia and polyuria.  Genitourinary: Negative for dysuria, flank pain, frequency and urgency.  Musculoskeletal: Positive for arthralgias and myalgias.  Skin: Negative for rash.  Allergic/Immunologic: Positive for environmental allergies.  Neurological: Positive for headaches.  Hematological: Positive for adenopathy.  Psychiatric/Behavioral: Positive for dysphoric mood. The patient is nervous/anxious.        Well managed on current medications.      Today's Vitals   01/12/18 0947  BP: (!) 153/74  Pulse: 82  Resp: 16  SpO2: 99%  Weight: 240 lb 3.2 oz (109 kg)  Height: 5\' 8"  (1.727 m)    Physical Exam  Constitutional: She is oriented to person, place, and time. She appears well-developed and well-nourished. No  distress.  HENT:  Head: Normocephalic and atraumatic.  Nose: Rhinorrhea present. Right sinus exhibits frontal sinus tenderness. Left sinus exhibits frontal sinus tenderness.  Mouth/Throat: Oropharynx is clear and moist. No oropharyngeal exudate.  Eyes: Pupils are equal, round, and reactive to light. EOM are normal.  Neck: Normal range of motion. Neck supple. No JVD present. Carotid bruit is not present. No tracheal deviation present. No thyromegaly present.   Cardiovascular: Normal rate, regular rhythm, normal heart sounds and intact distal pulses. Exam reveals no gallop and no friction rub.  No murmur heard. Pulmonary/Chest: Effort normal. No respiratory distress. She has wheezes. She has no rales. She exhibits no tenderness. Right breast exhibits no inverted nipple, no mass, no nipple discharge, no skin change and no tenderness. Left breast exhibits no inverted nipple, no mass, no nipple discharge, no skin change and no tenderness.  Soft, expiratory wheezes. Congested, non-productive cough noted.   Abdominal: Soft. Bowel sounds are normal. There is no tenderness.  Genitourinary: Vagina normal and uterus normal.  Genitourinary Comments: No tenderness, masses, or organomegaly present during bimanual exam.  Musculoskeletal: Normal range of motion.  Lymphadenopathy:    She has cervical adenopathy.  Neurological: She is alert and oriented to person, place, and time. No cranial nerve deficit.  Skin: Skin is warm and dry. She is not diaphoretic.  Psychiatric: She has a normal mood and affect. Her behavior is normal. Judgment and thought content normal.  Nursing note and vitals reviewed.    LABS: Recent Results (from the past 2160 hour(s))  Urinalysis, Routine w reflex microscopic     Status: Abnormal   Collection Time: 01/12/18  2:13 PM  Result Value Ref Range   Specific Gravity, UA 1.025 1.005 - 1.030   pH, UA 5.0 5.0 - 7.5   Color, UA Yellow Yellow   Appearance Ur Clear Clear   Leukocytes, UA Negative Negative   Protein, UA Negative Negative/Trace   Glucose, UA Negative Negative   Ketones, UA Negative Negative   RBC, UA Trace (A) Negative   Bilirubin, UA Negative Negative   Urobilinogen, Ur 0.2 0.2 - 1.0 mg/dL   Nitrite, UA Negative Negative   Microscopic Examination See below:     Comment: Microscopic was indicated and was performed.  Microscopic Examination     Status: Abnormal   Collection Time: 01/12/18  2:13 PM  Result Value Ref  Range   WBC, UA 0-5 0 - 5 /hpf   RBC, UA 0-2 0 - 2 /hpf   Epithelial Cells (non renal) 0-10 0 - 10 /hpf   Casts None seen None seen /lpf   Crystals Present (A) N/A   Crystal Type Calcium Oxalate N/A   Mucus, UA Present Not Estab.   Bacteria, UA Few None seen/Few   Assessment/Plan: 1. Encounter for general adult medical examination with abnormal findings Annual health maintenance exam with pap smear today.   2. Acute upper respiratory infection z-pack - take as directed for 5 days. OTC medication should be used to alleviate symptoms.  - azithromycin (ZITHROMAX) 250 MG tablet; z-pack - take as directed for 5 days  Dispense: 6 tablet; Refill: 0  3. Mixed hyperlipidemia Check fasting lipid panel and adjust cholesterol medication as indicated.   4. Recurrent major depressive disorder, in partial remission (HCC) conitnue antidepressant therapy as prescribed. Symptoms very well managed  5. Rheumatoid arthritis, involving unspecified site, unspecified rheumatoid factor presence (HCC) Continue regular visits with Dr. Gavin Potters as scheduled   6. Routine cervical smear - Pap  IG and HPV (high risk) DNA detection  7. Dysuria - Urinalysis, Routine w reflex microscopic    General Counseling: Letitia Neri understanding of the findings of todays visit and agrees with plan of treatment. I have discussed any further diagnostic evaluation that may be needed or ordered today. We also reviewed her medications today. she has been encouraged to call the office with any questions or concerns that should arise related to todays visit.  Rest and increase fluids. Continue using OTC medication to control symptoms.   This patient was seen by Vincent Gros, FNP- C in Collaboration with Dr Lyndon Code as a part of collaborative care agreement   Orders Placed This Encounter  Procedures  . Microscopic Examination  . Urinalysis, Routine w reflex microscopic    Meds ordered this encounter   Medications  . azithromycin (ZITHROMAX) 250 MG tablet    Sig: z-pack - take as directed for 5 days    Dispense:  6 tablet    Refill:  0    Order Specific Question:   Supervising Provider    Answer:   Lyndon Code [[id number]]    Time spent: 25 Minutes   Lyndon Code, MD  Internal Medicine

## 2018-01-13 LAB — URINALYSIS, ROUTINE W REFLEX MICROSCOPIC
Bilirubin, UA: NEGATIVE
Glucose, UA: NEGATIVE
Ketones, UA: NEGATIVE
LEUKOCYTES UA: NEGATIVE
Nitrite, UA: NEGATIVE
PH UA: 5 (ref 5.0–7.5)
PROTEIN UA: NEGATIVE
Specific Gravity, UA: 1.025 (ref 1.005–1.030)
Urobilinogen, Ur: 0.2 mg/dL (ref 0.2–1.0)

## 2018-01-13 LAB — MICROSCOPIC EXAMINATION: CASTS: NONE SEEN /LPF

## 2018-01-14 DIAGNOSIS — F3341 Major depressive disorder, recurrent, in partial remission: Secondary | ICD-10-CM | POA: Insufficient documentation

## 2018-01-14 DIAGNOSIS — M069 Rheumatoid arthritis, unspecified: Secondary | ICD-10-CM | POA: Insufficient documentation

## 2018-01-14 DIAGNOSIS — Z1239 Encounter for other screening for malignant neoplasm of breast: Secondary | ICD-10-CM | POA: Insufficient documentation

## 2018-01-14 DIAGNOSIS — Z124 Encounter for screening for malignant neoplasm of cervix: Secondary | ICD-10-CM

## 2018-01-14 DIAGNOSIS — J069 Acute upper respiratory infection, unspecified: Secondary | ICD-10-CM | POA: Insufficient documentation

## 2018-01-14 DIAGNOSIS — R300 Dysuria: Secondary | ICD-10-CM | POA: Insufficient documentation

## 2018-01-14 DIAGNOSIS — E782 Mixed hyperlipidemia: Secondary | ICD-10-CM | POA: Insufficient documentation

## 2018-01-15 LAB — PAP IG AND HPV HIGH-RISK
HPV, high-risk: NEGATIVE
PAP SMEAR COMMENT: 0

## 2018-01-21 ENCOUNTER — Other Ambulatory Visit
Admission: RE | Admit: 2018-01-21 | Discharge: 2018-01-21 | Disposition: A | Discharge status: Home / Self Care | Payer: No Typology Code available for payment source | Source: Ambulatory Visit | Attending: Nurse Practitioner | Admitting: Nurse Practitioner

## 2018-01-21 DIAGNOSIS — I10 Essential (primary) hypertension: Secondary | ICD-10-CM | POA: Diagnosis present

## 2018-01-21 DIAGNOSIS — E7800 Pure hypercholesterolemia, unspecified: Secondary | ICD-10-CM | POA: Diagnosis not present

## 2018-01-21 LAB — COMPREHENSIVE METABOLIC PANEL
ALT: 20 U/L (ref 14–54)
ANION GAP: 7 (ref 5–15)
AST: 20 U/L (ref 15–41)
Albumin: 4 g/dL (ref 3.5–5.0)
Alkaline Phosphatase: 104 U/L (ref 38–126)
BILIRUBIN TOTAL: 0.5 mg/dL (ref 0.3–1.2)
BUN: 18 mg/dL (ref 6–20)
CO2: 26 mmol/L (ref 22–32)
Calcium: 9.2 mg/dL (ref 8.9–10.3)
Chloride: 106 mmol/L (ref 101–111)
Creatinine, Ser: 0.55 mg/dL (ref 0.44–1.00)
GFR calc Af Amer: >60 mL/min (ref >60)
Glucose, Bld: 105 mg/dL — ABNORMAL HIGH (ref 65–99)
POTASSIUM: 3.7 mmol/L (ref 3.5–5.1)
Sodium: 139 mmol/L (ref 135–145)
TOTAL PROTEIN: 7.7 g/dL (ref 6.5–8.1)

## 2018-01-21 LAB — LIPID PANEL
CHOL/HDL RATIO: 4.6 ratio
CHOLESTEROL: 176 mg/dL (ref 0–200)
HDL: 38 mg/dL — AB (ref >40)
LDL Cholesterol: 76 mg/dL (ref 0–99)
Triglycerides: 309 mg/dL — ABNORMAL HIGH (ref <150)
VLDL: 62 mg/dL — ABNORMAL HIGH (ref 0–40)

## 2018-01-21 LAB — TSH: TSH: 1.326 u[IU]/mL (ref 0.350–4.500)

## 2018-01-21 LAB — T4, FREE: FREE T4: 0.67 ng/dL — AB (ref 0.82–1.77)

## 2018-01-22 NOTE — Progress Notes (Signed)
Pt was notified./kp

## 2018-01-22 NOTE — Progress Notes (Signed)
Pt was notified.Karen Castro

## 2018-03-11 ENCOUNTER — Encounter: Payer: Self-pay | Admitting: Pharmacist

## 2018-03-11 ENCOUNTER — Ambulatory Visit (INDEPENDENT_AMBULATORY_CARE_PROVIDER_SITE_OTHER): Payer: No Typology Code available for payment source | Admitting: Pharmacist

## 2018-03-11 ENCOUNTER — Encounter: Payer: No Typology Code available for payment source | Admitting: Pharmacist

## 2018-03-11 DIAGNOSIS — Z79899 Other long term (current) drug therapy: Secondary | ICD-10-CM

## 2018-03-11 MED ORDER — ADALIMUMAB 40 MG/0.4ML ~~LOC~~ AJKT: 40.0000 mg | AUTO-INJECTOR | SUBCUTANEOUS | 6 refills | Status: DC

## 2018-03-11 MED FILL — HUMIRA PEN 40 MG/0.4ML PNKT: 40 | 28 days supply | Qty: 2 | Fill #0

## 2018-03-11 NOTE — Progress Notes (Signed)
   S: [facility].  Patient is currently prescribed Humira for rheumatoid arthritis but she has not started it yet. She has read through a packet of information on Humira. Patient is managed by Dr. Gavin Potters for this.   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  Dosing:  Rheumatoid arthritis: SubQ: 40 mg every other week (may continue methotrexate, other nonbiologic DMARDS, corticosteroids, NSAIDs, and/or analgesics); patients not taking concomitant methotrexate may increase dose to 40 mg every week  Dose adjustments: Renal: no dose adjustments (has not been studied) Hepatic: no dose adjustments (has not been studied)  Drug-drug interactions: none  Screening: TB test: completed per patient Hepatitis: completed per patient  Monitoring: S/sx of infection: denies CBC: regularly monitored by Dr. Gavin Potters (patient has been on methotrexate) S/sx of hypersensitivity: has not started S/sx of malignancy: denies S/sx of heart failure: denies  O:     Lab Results  Component Value Date   WBC 9.1 06/15/2015   HGB 13.2 06/15/2015   HCT 39.7 06/15/2015   MCV 89.5 06/15/2015   PLT 264 06/15/2015      Chemistry      Component Value Date/Time   NA 139 01/21/2018 1131   K 3.7 01/21/2018 1131   CL 106 01/21/2018 1131   CO2 26 01/21/2018 1131   BUN 18 01/21/2018 1131   CREATININE 0.55 01/21/2018 1131      Component Value Date/Time   CALCIUM 9.2 01/21/2018 1131   ALKPHOS 104 01/21/2018 1131   AST 20 01/21/2018 1131   ALT 20 01/21/2018 1131   BILITOT 0.5 01/21/2018 1131       A/P: 1. Medication review: Patient currently on Humira for rheumatoid arthritis and has not started it yet. Reviewed the medication with the patient, including the following: Humira is a TNF blocking agent indicated for ankylosing spondylitis, Crohn's disease,  Hidradenitis suppurativa, psoriatic arthritis, plaque psoriasis, ulcerative colitis, and uveitis. Patient educated on purpose, proper use and potential adverse effects of Humira. The most common adverse effects are infections, headache, and injection site reactions. There is the possibility of an increased risk of malignancy but it is not well understood if this increased risk is due to there medication or the disease state. There are rare cases of pancytopenia and aplastic anemia. No recommendations for any changes at this time.   Alvino Blood, PharmD, BCPS, BCACP, CPP Clinical Pharmacist Practitioner  [phone]

## 2018-04-07 ENCOUNTER — Other Ambulatory Visit: Payer: Self-pay

## 2018-04-07 MED ORDER — OMEPRAZOLE 20 MG PO CPDR: 20.0000 mg | DELAYED_RELEASE_CAPSULE | Freq: Every day | ORAL | 6 refills | Status: DC

## 2018-04-07 MED ORDER — ROSUVASTATIN CALCIUM 5 MG PO TABS: 5.0000 mg | ORAL_TABLET | Freq: Every day | ORAL | 5 refills | Status: DC

## 2018-04-07 MED FILL — HUMIRA PEN 40 MG/0.4ML PNKT: 40 | 28 days supply | Qty: 2 | Fill #1

## 2018-05-13 ENCOUNTER — Encounter: Payer: Self-pay | Admitting: Adult Health

## 2018-05-13 ENCOUNTER — Ambulatory Visit (INDEPENDENT_AMBULATORY_CARE_PROVIDER_SITE_OTHER): Payer: No Typology Code available for payment source | Admitting: Adult Health

## 2018-05-13 VITALS — BP 146/89 | HR 90 | Temp 98.0°F | Resp 16 | Ht 68.0 in | Wt 237.0 lb

## 2018-05-13 DIAGNOSIS — R05 Cough: Secondary | ICD-10-CM

## 2018-05-13 DIAGNOSIS — J40 Bronchitis, not specified as acute or chronic: Secondary | ICD-10-CM | POA: Diagnosis not present

## 2018-05-13 DIAGNOSIS — J069 Acute upper respiratory infection, unspecified: Secondary | ICD-10-CM | POA: Diagnosis not present

## 2018-05-13 DIAGNOSIS — R0989 Other specified symptoms and signs involving the circulatory and respiratory systems: Secondary | ICD-10-CM

## 2018-05-13 DIAGNOSIS — R058 Other specified cough: Secondary | ICD-10-CM

## 2018-05-13 MED ORDER — PREDNISONE 10 MG PO TABS: ORAL_TABLET | ORAL | 0 refills | Status: DC

## 2018-05-13 MED ORDER — AZITHROMYCIN 250 MG PO TABS: ORAL_TABLET | ORAL | 0 refills | Status: DC

## 2018-05-13 MED ORDER — GUAIFENESIN-CODEINE 100-10 MG/5ML PO SYRP: 5.0000 mL | ORAL_SOLUTION | Freq: Every evening | ORAL | 0 refills | Status: DC | PRN

## 2018-05-13 NOTE — Patient Instructions (Signed)
Upper Respiratory Infection, Adult Most upper respiratory infections (URIs) are caused by a virus. A URI affects the nose, throat, and upper air passages. The most common type of URI is often called "the common cold." Follow these instructions at home:  Take medicines only as told by your doctor.  Gargle warm saltwater or take cough drops to comfort your throat as told by your doctor.  Use a warm mist humidifier or inhale steam from a shower to increase air moisture. This may make it easier to breathe.  Drink enough fluid to keep your pee (urine) clear or pale yellow.  Eat soups and other clear broths.  Have a healthy diet.  Rest as needed.  Go back to work when your fever is gone or your doctor says it is okay. ? You may need to stay home longer to avoid giving your URI to others. ? You can also wear a face mask and wash your hands often to prevent spread of the virus.  Use your inhaler more if you have asthma.  Do not use any tobacco products, including cigarettes, chewing tobacco, or electronic cigarettes. If you need help quitting, ask your doctor. Contact a doctor if:  You are getting worse, not better.  Your symptoms are not helped by medicine.  You have chills.  You are getting more short of breath.  You have brown or red mucus.  You have yellow or brown discharge from your nose.  You have pain in your face, especially when you bend forward.  You have a fever.  You have puffy (swollen) neck glands.  You have pain while swallowing.  You have white areas in the back of your throat. Get help right away if:  You have very bad or constant: ? Headache. ? Ear pain. ? Pain in your forehead, behind your eyes, and over your cheekbones (sinus pain). ? Chest pain.  You have long-lasting (chronic) lung disease and any of the following: ? Wheezing. ? Long-lasting cough. ? Coughing up blood. ? A change in your usual mucus.  You have a stiff neck.  You have  changes in your: ? Vision. ? Hearing. ? Thinking. ? Mood. This information is not intended to replace advice given to you by your health care provider. Make sure you discuss any questions you have with your health care provider. Document Released: 02/04/2008 Document Revised: 04/20/2016 Document Reviewed: 11/23/2013 Elsevier Interactive Patient Education  2018 Elsevier Inc.  

## 2018-05-13 NOTE — Progress Notes (Signed)
[facility] [address] [place], [place] [address]  Internal MEDICINE  Office Visit Note  Patient Name: Karen Castro  [id number]  [id number]  Date of Service: 05/24/2018  Chief Complaint  Patient presents with  . Bronchitis  . Cough     HPI Pt is here for a sick visit.  She reports she has been coughing for three weeks. She denies fever or overt SOB.  She reports what she is coughing up is white phlegm. She has an albuterol inhaler, and has needed it more over the last two weeks.  She is a current smoker, she reports she smokes 1 Pack every 2-3 days. She is having trouble sleeping due to cough.     Current Medication:  Outpatient Encounter Medications as of 05/13/2018  Medication Sig  . Adalimumab (HUMIRA PEN) 40 MG/0.4ML PNKT Inject 40 mg into the skin every 14 (fourteen) days. Inject 40 mg under the skin every 2 weeks as directed  . albuterol (PROVENTIL HFA;VENTOLIN HFA) 108 (90 Base) MCG/ACT inhaler Inhale 2 puffs into the lungs every 6 (six) hours as needed for wheezing or shortness of breath.  . Desvenlafaxine ER (PRISTIQ) 50 MG TB24 Take 3 tablets (150 mg total) by mouth daily.  . folic acid (FOLVITE) 1 MG tablet Take by mouth.  . loratadine (CLARITIN) 10 MG tablet Take 10 mg by mouth daily.  . methotrexate (RHEUMATREX) 2.5 MG tablet Take by mouth.  Marland Kitchen omeprazole (PRILOSEC) 20 MG capsule Take 1 capsule (20 mg total) by mouth daily.  . rosuvastatin (CRESTOR) 5 MG tablet Take 1 tablet (5 mg total) by mouth daily with supper.  Marland Kitchen azithromycin (ZITHROMAX) 250 MG tablet z-pack - take as directed for 5 days (Patient not taking: Reported on 05/13/2018)  . azithromycin (ZITHROMAX) 250 MG tablet Take 2 tabs on Day 1, and One tab on days 2-5.  Marland Kitchen guaiFENesin-codeine (ROBITUSSIN AC) 100-10 MG/5ML syrup Take 5 mLs by mouth at bedtime as needed for cough.  Marland Kitchen oxyCODONE-acetaminophen (PERCOCET/ROXICET) 5-325 MG tablet Take 1 tablet by mouth every 8 (eight) hours as needed for  severe pain. Do not drive while taking as can cause drowsiness. (Patient not taking: Reported on 05/13/2018)  . predniSONE (DELTASONE) 10 MG tablet Use per dose pack  . [DISCONTINUED] cetirizine (ZYRTEC) 10 MG tablet Take 10 mg by mouth daily.   No facility-administered encounter medications on file as of 05/13/2018.       Medical History: Past Medical History:  Diagnosis Date  . Arthritis   . Hypercholesteremia      Vital Signs: BP (!) 146/89   Pulse 90   Temp 98 F (36.7 C)   Resp 16   Ht 5\' 8"  (1.727 m)   Wt 237 lb (107.5 kg)   SpO2 96%   BMI 36.04 kg/m    Review of Systems  Constitutional: Negative for chills, fatigue and unexpected weight change.  HENT: Negative for congestion, rhinorrhea, sneezing and sore throat.   Eyes: Negative for photophobia, pain and redness.  Respiratory: Negative for cough, chest tightness and shortness of breath.   Cardiovascular: Negative for chest pain and palpitations.  Gastrointestinal: Negative for abdominal pain, constipation, diarrhea, nausea and vomiting.  Endocrine: Negative.   Genitourinary: Negative for dysuria and frequency.  Musculoskeletal: Negative for arthralgias, back pain, joint swelling and neck pain.  Skin: Negative for rash.  Allergic/Immunologic: Negative.   Neurological: Negative for tremors and numbness.  Hematological: Negative for adenopathy. Does not bruise/bleed easily.  Psychiatric/Behavioral: Negative for  behavioral problems and sleep disturbance. The patient is not nervous/anxious.     Physical Exam  Constitutional: She is oriented to person, place, and time. She appears well-developed and well-nourished. No distress.  HENT:  Head: Normocephalic and atraumatic.  Mouth/Throat: Oropharynx is clear and moist. No oropharyngeal exudate.  Eyes: Pupils are equal, round, and reactive to light. EOM are normal.  Neck: Normal range of motion. Neck supple. No JVD present. No tracheal deviation present. No  thyromegaly present.  Cardiovascular: Normal rate, regular rhythm and normal heart sounds. Exam reveals no gallop and no friction rub.  No murmur heard. Pulmonary/Chest: Effort normal and breath sounds normal. No respiratory distress. She has no wheezes. She has no rales. She exhibits no tenderness.  Abdominal: Soft. There is no tenderness. There is no guarding.  Musculoskeletal: Normal range of motion.  Lymphadenopathy:    She has no cervical adenopathy.  Neurological: She is alert and oriented to person, place, and time. No cranial nerve deficit.  Skin: Skin is warm and dry. She is not diaphoretic.  Psychiatric: She has a normal mood and affect. Her behavior is normal. Judgment and thought content normal.  Nursing note and vitals reviewed.  Assessment/Plan: 1. Upper respiratory tract infection, unspecified type - azithromycin (ZITHROMAX) 250 MG tablet; Take 2 tabs on Day 1, and One tab on days 2-5.  Dispense: 6 tablet; Refill: 0  2. Chest congestion Will consider CXR if symptoms do not improve with medications or if fever develops.   3. Bronchitis - predniSONE (DELTASONE) 10 MG tablet; Use per dose pack  Dispense: 21 tablet; Refill: 0  4. Nocturnal cough - guaiFENesin-codeine (ROBITUSSIN AC) 100-10 MG/5ML syrup; Take 5 mLs by mouth at bedtime as needed for cough.  Dispense: 120 mL; Refill:  General Counseling: Karen Castro verbalizes understanding of the findings of todays visit and agrees with plan of treatment. I have discussed any further diagnostic evaluation that may be needed or ordered today. We also reviewed her medications today. she has been encouraged to call the office with any questions or concerns that should arise related to todays visit.   Meds ordered this encounter  Medications  . azithromycin (ZITHROMAX) 250 MG tablet    Sig: Take 2 tabs on Day 1, and One tab on days 2-5.    Dispense:  6 tablet    Refill:  0  . predniSONE (DELTASONE) 10 MG tablet    Sig: Use per dose  pack    Dispense:  21 tablet    Refill:  0  . guaiFENesin-codeine (ROBITUSSIN AC) 100-10 MG/5ML syrup    Sig: Take 5 mLs by mouth at bedtime as needed for cough.    Dispense:  120 mL    Refill:  0    Time spent: 25 Minutes  This patient was seen by Blima Ledger AGNP-C in Collaboration with Dr Lyndon Code as a part of collaborative care agreement

## 2018-06-08 MED FILL — HUMIRA PEN 40 MG/0.4ML PNKT: 40 | 28 days supply | Qty: 2 | Fill #2

## 2018-07-05 MED FILL — HUMIRA PEN 40 MG/0.4ML PNKT: 40 | 28 days supply | Qty: 2 | Fill #3

## 2018-07-19 ENCOUNTER — Ambulatory Visit: Payer: Self-pay | Admitting: Nurse Practitioner

## 2018-07-20 ENCOUNTER — Ambulatory Visit (INDEPENDENT_AMBULATORY_CARE_PROVIDER_SITE_OTHER): Payer: No Typology Code available for payment source | Admitting: Nurse Practitioner

## 2018-07-20 ENCOUNTER — Encounter: Payer: Self-pay | Admitting: Nurse Practitioner

## 2018-07-20 VITALS — BP 132/84 | HR 77 | Resp 16 | Ht 68.0 in | Wt 240.0 lb

## 2018-07-20 DIAGNOSIS — K219 Gastro-esophageal reflux disease without esophagitis: Secondary | ICD-10-CM | POA: Diagnosis not present

## 2018-07-20 DIAGNOSIS — E782 Mixed hyperlipidemia: Secondary | ICD-10-CM

## 2018-07-20 DIAGNOSIS — F331 Major depressive disorder, recurrent, moderate: Secondary | ICD-10-CM

## 2018-07-20 DIAGNOSIS — G4719 Other hypersomnia: Secondary | ICD-10-CM | POA: Diagnosis not present

## 2018-07-20 MED ORDER — DESVENLAFAXINE ER 50 MG PO TB24: 150.0000 mg | ORAL_TABLET | Freq: Every day | ORAL | 5 refills | Status: DC

## 2018-07-20 MED ORDER — ROSUVASTATIN CALCIUM 5 MG PO TABS: 5.0000 mg | ORAL_TABLET | Freq: Every day | ORAL | 3 refills | Status: DC

## 2018-07-20 MED ORDER — OMEPRAZOLE 20 MG PO CPDR: 20.0000 mg | DELAYED_RELEASE_CAPSULE | Freq: Every day | ORAL | 3 refills | Status: DC

## 2018-07-20 NOTE — Progress Notes (Signed)
[facility] [address] [place], [place] [address]  Internal MEDICINE  Office Visit Note  Patient Name: Karen Castro  [id number]  [id number]  Date of Service: 07/20/2018  Chief Complaint  Patient presents with  . Arthritis    The patient is here for routine follow up exam. She states that she is doing well with no concerns or complaints today. She does state that her husband has told her that she breathes very heavily and loudly at night. Sometimes, she stops breathing. States that she is very tired most of the time. 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 This likely contributes to her fatigue.  She has chronic depression/anxiety. She takes pristiq 150mg  every day. It helps to keep symptoms manageable and controlled. She needs to have refills for this today, as well as her other routine medications.       Current Medication: Outpatient Encounter Medications as of 07/20/2018  Medication Sig  . Adalimumab (HUMIRA PEN) 40 MG/0.4ML PNKT Inject 40 mg into the skin every 14 (fourteen) days. Inject 40 mg under the skin every 2 weeks as directed  . albuterol (PROVENTIL HFA;VENTOLIN HFA) 108 (90 Base) MCG/ACT inhaler Inhale 2 puffs into the lungs every 6 (six) hours as needed for wheezing or shortness of breath.  . Desvenlafaxine ER (PRISTIQ) 50 MG TB24 Take 3 tablets (150 mg total) by mouth daily.  . folic acid (FOLVITE) 1 MG tablet Take by mouth.  07/22/2018 guaiFENesin-codeine (ROBITUSSIN AC) 100-10 MG/5ML syrup Take 5 mLs by mouth at bedtime as needed for cough.  . loratadine (CLARITIN) 10 MG tablet Take 10 mg by mouth daily.  . methotrexate (RHEUMATREX) 2.5 MG tablet Take by mouth.  Marland Kitchen omeprazole (PRILOSEC) 20 MG capsule Take 1 capsule (20 mg total) by mouth daily.  Marland Kitchen oxyCODONE-acetaminophen (PERCOCET/ROXICET) 5-325 MG tablet Take 1 tablet by mouth every 8 (eight) hours as needed for severe pain. Do not drive while taking as can cause  drowsiness.  . predniSONE (DELTASONE) 10 MG tablet Use per dose pack  . rosuvastatin (CRESTOR) 5 MG tablet Take 1 tablet (5 mg total) by mouth daily with supper.  . [DISCONTINUED] azithromycin (ZITHROMAX) 250 MG tablet z-pack - take as directed for 5 days  . [DISCONTINUED] Desvenlafaxine ER (PRISTIQ) 50 MG TB24 Take 3 tablets (150 mg total) by mouth daily.  . [DISCONTINUED] omeprazole (PRILOSEC) 20 MG capsule Take 1 capsule (20 mg total) by mouth daily.  . [DISCONTINUED] rosuvastatin (CRESTOR) 5 MG tablet Take 1 tablet (5 mg total) by mouth daily with supper.  Marland Kitchen azithromycin (ZITHROMAX) 250 MG tablet Take 2 tabs on Day 1, and One tab on days 2-5. (Patient not taking: Reported on 07/20/2018)   No facility-administered encounter medications on file as of 07/20/2018.     Surgical History: Past Surgical History:  Procedure Laterality Date  . ABDOMINAL HYSTERECTOMY    . BREAST BIOPSY Right    core- neg    Medical History: Past Medical History:  Diagnosis Date  . Arthritis   . Hypercholesteremia     Family History: Family History  Problem Relation Age of Onset  . Breast cancer Maternal Grandmother 69    Social History   Socioeconomic History  . Marital status: Married    Spouse name: Not on file  . Number of children: Not on file  . Years of education: Not on file  . Highest education level: Not on file  Occupational History  .  Not on file  Social Needs  . Financial resource strain: Not on file  . Food insecurity:    Worry: Not on file    Inability: Not on file  . Transportation needs:    Medical: Not on file    Non-medical: Not on file  Tobacco Use  . Smoking status: Current Every Day Smoker    Types: Cigarettes  . Smokeless tobacco: Never Used  Substance and Sexual Activity  . Alcohol use: Yes    Comment: socially  . Drug use: No  . Sexual activity: Not on file  Lifestyle  . Physical activity:    Days per week: Not on file    Minutes per session: Not on file   . Stress: Not on file  Relationships  . Social connections:    Talks on phone: Not on file    Gets together: Not on file    Attends religious service: Not on file    Active member of club or organization: Not on file    Attends meetings of clubs or organizations: Not on file    Relationship status: Not on file  . Intimate partner violence:    Fear of current or ex partner: Not on file    Emotionally abused: Not on file    Physically abused: Not on file    Forced sexual activity: Not on file  Other Topics Concern  . Not on file  Social History Narrative  . Not on file      Review of Systems  Constitutional: Positive for fatigue. Negative for activity change, appetite change, chills and fever.  HENT: Negative for congestion, ear pain, postnasal drip, rhinorrhea, sinus pain, sneezing, sore throat and voice change.   Eyes: Negative.   Respiratory: Negative for cough and wheezing.   Cardiovascular: Negative for chest pain and palpitations.  Gastrointestinal: Negative for abdominal pain, constipation, diarrhea, nausea and vomiting.  Endocrine: Negative for cold intolerance, heat intolerance, polydipsia, polyphagia and polyuria.  Musculoskeletal: Positive for arthralgias and myalgias.       Sees Dr. Gavin Potters, rheumatology for management. Recently started on Humira. Continues to take methotrexate and will take prednisone as needed for flares .  Skin: Negative for rash.  Allergic/Immunologic: Negative for environmental allergies.  Neurological: Negative for headaches.  Hematological: Positive for adenopathy.  Psychiatric/Behavioral: Positive for dysphoric mood. The patient is nervous/anxious.        Well managed on current medications.     Vital Signs: BP 132/84   Pulse 77   Resp 16   Ht 5\' 8"  (1.727 m)   Wt 240 lb (108.9 kg)   SpO2 95%   BMI 36.49 kg/m    Physical Exam  Constitutional: She is oriented to person, place, and time. She appears well-developed and  well-nourished. No distress.  HENT:  Head: Normocephalic and atraumatic.  Nose: No rhinorrhea. Right sinus exhibits no frontal sinus tenderness. Left sinus exhibits no frontal sinus tenderness.  Mouth/Throat: No oropharyngeal exudate.  Eyes: Pupils are equal, round, and reactive to light. EOM are normal.  Neck: Normal range of motion. Neck supple. No JVD present. Carotid bruit is not present. No tracheal deviation present. No thyromegaly present.  Cardiovascular: Normal rate, regular rhythm and normal heart sounds. Exam reveals no gallop and no friction rub.  No murmur heard. Pulmonary/Chest: Effort normal and breath sounds normal. No respiratory distress. She has no wheezes. She has no rales. She exhibits no tenderness.  Abdominal: There is no tenderness.  Genitourinary: Vagina normal and  uterus normal.  Genitourinary Comments: No tenderness, masses, or organomegaly present during bimanual exam.  Musculoskeletal: Normal range of motion.  Lymphadenopathy:    She has no cervical adenopathy.  Neurological: She is alert and oriented to person, place, and time. No cranial nerve deficit.  Skin: Skin is warm and dry. She is not diaphoretic.  Psychiatric: She has a normal mood and affect. Her behavior is normal. Judgment and thought content normal.  Nursing note and vitals reviewed.  Assessment/Plan:  1. Excessive daytime sleepiness Patient has sign and symptoms of OSA ( disturbed sleep, excessive fatigue during the day, uncontrolled bp and abnormal BMI). Baseline sleep study is ordered to further look into this. Long term complications of OSA was addressed with the patient. - Home sleep test  2. Moderate recurrent major depression (HCC) Stable. Continue pristiq 150mg  daily. Refills provided today - Desvenlafaxine ER (PRISTIQ) 50 MG TB24; Take 3 tablets (150 mg total) by mouth daily.  Dispense: 270 tablet; Refill: 5  3. Mixed hyperlipidemia Continue crestor as prescribed  - rosuvastatin  (CRESTOR) 5 MG tablet; Take 1 tablet (5 mg total) by mouth daily with supper.  Dispense: 90 tablet; Refill: 3  4. Gastro-esophageal reflux disease without esophagitis Stable. Continue omeprazole as prescribed - omeprazole (PRILOSEC) 20 MG capsule; Take 1 capsule (20 mg total) by mouth daily.  Dispense: 90 capsule; Refill: 3  General Counseling: Pricella verbalizes understanding of the findings of todays visit and agrees with plan of treatment. I have discussed any further diagnostic evaluation that may be needed or ordered today. We also reviewed her medications today. she has been encouraged to call the office with any questions or concerns that should arise related to todays visit.  This patient was seen by FNP Collaboration with Dr Vincent Gros as a part of collaborative care agreement  Orders Placed This Encounter  Procedures  . Home sleep test    Meds ordered this encounter  Medications  . Desvenlafaxine ER (PRISTIQ) 50 MG TB24    Sig: Take 3 tablets (150 mg total) by mouth daily.    Dispense:  270 tablet    Refill:  5    Order Specific Question:   Supervising Provider    Answer:   Lyndon Code [[id number]]  . rosuvastatin (CRESTOR) 5 MG tablet    Sig: Take 1 tablet (5 mg total) by mouth daily with supper.    Dispense:  90 tablet    Refill:  3    Order Specific Question:   Supervising Provider    Answer:   Lyndon Code [[id number]]  . omeprazole (PRILOSEC) 20 MG capsule    Sig: Take 1 capsule (20 mg total) by mouth daily.    Dispense:  90 capsule    Refill:  3    Order Specific Question:   Supervising Provider    Answer:   Lyndon Code [[id number]]    Time spent: 20 Minutes      Dr 22 Internal medicine

## 2018-08-03 ENCOUNTER — Other Ambulatory Visit: Payer: Self-pay | Admitting: Internal Medicine

## 2018-08-04 MED FILL — HUMIRA PEN 40 MG/0.4ML PNKT: 40 | 28 days supply | Qty: 2 | Fill #4

## 2018-08-10 ENCOUNTER — Ambulatory Visit: Payer: Self-pay | Admitting: Adult Health

## 2018-08-16 ENCOUNTER — Other Ambulatory Visit: Payer: Self-pay | Admitting: Nurse Practitioner

## 2018-08-16 ENCOUNTER — Telehealth: Payer: Self-pay

## 2018-08-16 DIAGNOSIS — J069 Acute upper respiratory infection, unspecified: Secondary | ICD-10-CM

## 2018-08-16 MED ORDER — AZITHROMYCIN 250 MG PO TABS: ORAL_TABLET | ORAL | 0 refills | Status: DC

## 2018-08-16 NOTE — Telephone Encounter (Signed)
Patient c/o sinus congestion and headache. Add x-pack. Take as directed. If no improvement, will see patient in office.

## 2018-08-16 NOTE — Progress Notes (Signed)
Patient c/o sinus congestion and headache. Add x-pack. Take as directed. If no improvement, will see patient in office.

## 2018-08-16 NOTE — Telephone Encounter (Signed)
Called twice and no answer.

## 2018-10-11 MED FILL — HUMIRA PEN 40 MG/0.4ML PNKT: 40 | 28 days supply | Qty: 2 | Fill #5

## 2018-11-08 MED FILL — HUMIRA PEN 40 MG/0.4ML PNKT: 40 | 28 days supply | Qty: 2 | Fill #6

## 2018-11-11 ENCOUNTER — Encounter: Payer: Self-pay | Admitting: Adult Health

## 2018-11-11 ENCOUNTER — Ambulatory Visit (INDEPENDENT_AMBULATORY_CARE_PROVIDER_SITE_OTHER): Payer: No Typology Code available for payment source | Admitting: Adult Health

## 2018-11-11 VITALS — BP 128/76 | HR 76 | Temp 97.6°F | Resp 16 | Ht 68.0 in | Wt 235.0 lb

## 2018-11-11 DIAGNOSIS — K219 Gastro-esophageal reflux disease without esophagitis: Secondary | ICD-10-CM | POA: Diagnosis not present

## 2018-11-11 DIAGNOSIS — J22 Unspecified acute lower respiratory infection: Secondary | ICD-10-CM

## 2018-11-11 DIAGNOSIS — F331 Major depressive disorder, recurrent, moderate: Secondary | ICD-10-CM | POA: Diagnosis not present

## 2018-11-11 NOTE — Progress Notes (Signed)
[facility] [address] [place], [place] [id number]  Internal MEDICINE  Office Visit Note  Patient Name: Karen BueLinda Sharpe Castro  [id number]  [id number]  Date of Service: 11/11/2018  Chief Complaint  Patient presents with  . Cough    dry cough, chest hurts from coughing so much      HPI Pt is here for a sick visit.  Patient reports that she has had this dry cough for 6 days.  She points that she is coughed so much now that her chest is hurting.  She has not traveled anywhere.  She reports that she does continue to smoke about a half a pack per day.  She has had a mild fever and chills.     Current Medication:  Outpatient Encounter Medications as of 11/11/2018  Medication Sig  . Adalimumab (HUMIRA PEN) 40 MG/0.4ML PNKT Inject 40 mg into the skin every 14 (fourteen) days. Inject 40 mg under the skin every 2 weeks as directed  . albuterol (PROVENTIL HFA;VENTOLIN HFA) 108 (90 Base) MCG/ACT inhaler Inhale 2 puffs into the lungs every 6 (six) hours as needed for wheezing or shortness of breath.  Marland Kitchen. azithromycin (ZITHROMAX) 250 MG tablet z-pack - take as directed for 5 days  . Desvenlafaxine ER (PRISTIQ) 50 MG TB24 Take 3 tablets (150 mg total) by mouth daily.  . folic acid (FOLVITE) 1 MG tablet Take by mouth.  Marland Kitchen. guaiFENesin-codeine (ROBITUSSIN AC) 100-10 MG/5ML syrup Take 5 mLs by mouth at bedtime as needed for cough.  . loratadine (CLARITIN) 10 MG tablet Take 10 mg by mouth daily.  . methotrexate (RHEUMATREX) 2.5 MG tablet Take by mouth.  Marland Kitchen. omeprazole (PRILOSEC) 20 MG capsule Take 1 capsule (20 mg total) by mouth daily.  Marland Kitchen. oxyCODONE-acetaminophen (PERCOCET/ROXICET) 5-325 MG tablet Take 1 tablet by mouth every 8 (eight) hours as needed for severe pain. Do not drive while taking as can cause drowsiness.  . predniSONE (DELTASONE) 10 MG tablet Use per dose pack  . rosuvastatin (CRESTOR) 5 MG tablet Take 1 tablet (5 mg total) by mouth daily with supper.   No facility-administered  encounter medications on file as of 11/11/2018.       Medical History: Past Medical History:  Diagnosis Date  . Arthritis   . Hypercholesteremia      Vital Signs: BP 128/76   Pulse 76   Temp 97.6 F (36.4 C)   Resp 16   Ht 5\' 8"  (1.727 m)   Wt 235 lb (106.6 kg)   SpO2 98%   BMI 35.73 kg/m    Review of Systems  Constitutional: Positive for fever. Negative for chills, fatigue and unexpected weight change.  HENT: Negative for congestion, rhinorrhea, sneezing and sore throat.   Eyes: Negative for photophobia, pain and redness.  Respiratory: Positive for cough, chest tightness and wheezing. Negative for shortness of breath.   Cardiovascular: Negative for chest pain and palpitations.  Gastrointestinal: Negative for abdominal pain, constipation, diarrhea, nausea and vomiting.  Endocrine: Negative.   Genitourinary: Negative for dysuria and frequency.  Musculoskeletal: Negative for arthralgias, back pain, joint swelling and neck pain.  Skin: Negative for rash.  Allergic/Immunologic: Negative.   Neurological: Negative for tremors and numbness.  Hematological: Negative for adenopathy. Does not bruise/bleed easily.  Psychiatric/Behavioral: Negative for behavioral problems and sleep disturbance. The patient is not nervous/anxious.     Physical Exam Vitals signs and nursing note reviewed.  Constitutional:      General: She is not in acute distress.  Appearance: She is well-developed. She is not diaphoretic.  HENT:     Head: Normocephalic and atraumatic.     Mouth/Throat:     Pharynx: No oropharyngeal exudate.  Eyes:     Pupils: Pupils are equal, round, and reactive to light.  Neck:     Musculoskeletal: Normal range of motion and neck supple.     Thyroid: No thyromegaly.     Vascular: No JVD.     Trachea: No tracheal deviation.  Cardiovascular:     Rate and Rhythm: Normal rate and regular rhythm.     Heart sounds: Normal heart sounds. No murmur. No friction rub. No  gallop.   Pulmonary:     Effort: Pulmonary effort is normal. No respiratory distress.     Breath sounds: Wheezing present. No rales.  Chest:     Chest wall: Tenderness present.  Abdominal:     Palpations: Abdomen is soft.     Tenderness: There is no abdominal tenderness. There is no guarding.  Musculoskeletal: Normal range of motion.  Lymphadenopathy:     Cervical: No cervical adenopathy.  Skin:    General: Skin is warm and dry.  Neurological:     Mental Status: She is alert and oriented to person, place, and time.     Cranial Nerves: No cranial nerve deficit.  Psychiatric:        Behavior: Behavior normal.        Thought Content: Thought content normal.        Judgment: Judgment normal.    Assessment/Plan: 1. Acute lower respiratory infection Patient provided with course of azithromycin.  Instructed patient take all medications as prescribed.  Also given course of prednisone for inflammation and cough.  As well as albuterol inhaler refill. Azithromycin 250 mg.  #6, 0 refills. Prednisone 10 mg p.o.  6-day taper.  Number of tablets 21 0 refills. Albuterol inhaler 90 mcg 2 actuations every 6 hours as needed for wheezing.  2. Gastro-esophageal reflux disease without esophagitis Stable, continue current medication 3.  3. Moderate recurrent major depression (HCC) Stable, continue current medications.  General Counseling: tunisia beerman understanding of the findings of todays visit and agrees with plan of treatment. I have discussed any further diagnostic evaluation that may be needed or ordered today. We also reviewed her medications today. she has been encouraged to call the office with any questions or concerns that should arise related to todays visit.   No orders of the defined types were placed in this encounter.   No orders of the defined types were placed in this encounter.   Time spent:25 Minutes  This patient was seen by Blima Ledger AGNP-C in Collaboration with  Dr Lyndon Code as a part of collaborative care agreement.  Johnna Acosta AGNP-C Internal Medicine

## 2018-11-13 ENCOUNTER — Encounter: Payer: Self-pay | Admitting: Adult Health

## 2018-12-25 MED FILL — OMEPRAZOLE 20 MG CPDR: 20 | 30 days supply | Qty: 30 | Fill #0

## 2018-12-27 MED FILL — FOLIC ACID 1 MG TABS: 1 | 30 days supply | Qty: 30 | Fill #0

## 2019-01-15 MED FILL — ROSUVASTATIN CALCIUM 5 MG T: 5 | 90 days supply | Qty: 90 | Fill #0

## 2019-01-15 MED FILL — METHOTREXATE SODIUM 2.5 MG: 2.5 | 28 days supply | Qty: 28 | Fill #0

## 2019-01-15 MED FILL — DESVENLAFAXINE SUC ER 50 MG: 50 | 90 days supply | Qty: 270 | Fill #0

## 2019-01-17 MED FILL — OMEPRAZOLE 20 MG CPDR: 20 | 90 days supply | Qty: 90 | Fill #0

## 2019-01-20 MED FILL — FOLIC ACID 1 MG TABS: 1 | 90 days supply | Qty: 90 | Fill #1

## 2019-01-21 ENCOUNTER — Ambulatory Visit (INDEPENDENT_AMBULATORY_CARE_PROVIDER_SITE_OTHER): Payer: No Typology Code available for payment source | Admitting: Nurse Practitioner

## 2019-01-21 ENCOUNTER — Other Ambulatory Visit: Payer: Self-pay

## 2019-01-21 ENCOUNTER — Encounter: Payer: Self-pay | Admitting: Nurse Practitioner

## 2019-01-21 VITALS — BP 133/68 | HR 85 | Resp 16 | Ht 68.0 in | Wt 240.2 lb

## 2019-01-21 DIAGNOSIS — E782 Mixed hyperlipidemia: Secondary | ICD-10-CM

## 2019-01-21 DIAGNOSIS — F331 Major depressive disorder, recurrent, moderate: Secondary | ICD-10-CM | POA: Diagnosis not present

## 2019-01-21 DIAGNOSIS — R300 Dysuria: Secondary | ICD-10-CM

## 2019-01-21 DIAGNOSIS — Z0001 Encounter for general adult medical examination with abnormal findings: Secondary | ICD-10-CM | POA: Diagnosis not present

## 2019-01-21 DIAGNOSIS — R112 Nausea with vomiting, unspecified: Secondary | ICD-10-CM | POA: Diagnosis not present

## 2019-01-21 DIAGNOSIS — Z1239 Encounter for other screening for malignant neoplasm of breast: Secondary | ICD-10-CM

## 2019-01-21 MED ORDER — SCOPOLAMINE 1 MG/3DAYS TD PT72: 1.0000 | MEDICATED_PATCH | TRANSDERMAL | 1 refills | Status: DC

## 2019-01-21 NOTE — Progress Notes (Signed)
[facility] [address] [place], [place] [address]  Internal MEDICINE  Office Visit Note  Patient Name: Karen Castro  [id number]  [id number]  Date of Service: 01/21/2019  Chief Complaint  Patient presents with  . Annual Exam  . Hyperlipidemia     The patient is here for health maintenance exam. She is doing well. She has no concerns or complaints today. She is due to have routine, fasting labs drawn as well as mammogram. She states that she [place] be going [place] and is nervous about getting sea-sick. Would like to have prescription for scop patch to help with this.     Current Medication: Outpatient Encounter Medications as of 01/21/2019  Medication Sig  . Adalimumab (HUMIRA PEN) 40 MG/0.4ML PNKT Inject 40 mg into the skin every 14 (fourteen) days. Inject 40 mg under the skin every 2 weeks as directed  . albuterol (PROVENTIL HFA;VENTOLIN HFA) 108 (90 Base) MCG/ACT inhaler Inhale 2 puffs into the lungs every 6 (six) hours as needed for wheezing or shortness of breath.  Marland Kitchen azithromycin (ZITHROMAX) 250 MG tablet z-pack - take as directed for 5 days  . Desvenlafaxine ER (PRISTIQ) 50 MG TB24 Take 3 tablets (150 mg total) by mouth daily.  . folic acid (FOLVITE) 1 MG tablet Take by mouth.  Marland Kitchen guaiFENesin-codeine (ROBITUSSIN AC) 100-10 MG/5ML syrup Take 5 mLs by mouth at bedtime as needed for cough.  . loratadine (CLARITIN) 10 MG tablet Take 10 mg by mouth daily.  . methotrexate (RHEUMATREX) 2.5 MG tablet Take by mouth.  Marland Kitchen omeprazole (PRILOSEC) 20 MG capsule Take 1 capsule (20 mg total) by mouth daily.  Marland Kitchen oxyCODONE-acetaminophen (PERCOCET/ROXICET) 5-325 MG tablet Take 1 tablet by mouth every 8 (eight) hours as needed for severe pain. Do not drive while taking as can cause drowsiness.  . predniSONE (DELTASONE) 10 MG tablet Use per dose pack  . rosuvastatin (CRESTOR) 5 MG tablet Take 1 tablet (5 mg total) by mouth daily with supper.  Marland Kitchen  scopolamine (TRANSDERM-SCOP, 1.5 MG,) 1 MG/3DAYS Place 1 patch (1.5 mg total) onto the skin every 3 (three) days.   No facility-administered encounter medications on file as of 01/21/2019.     Surgical History: Past Surgical History:  Procedure Laterality Date  . ABDOMINAL HYSTERECTOMY    . BREAST BIOPSY Right    core- neg    Medical History: Past Medical History:  Diagnosis Date  . Arthritis   . Hypercholesteremia     Family History: Family History  Problem Relation Age of Onset  . Breast cancer Maternal Grandmother 74      Review of Systems  Constitutional: Negative for activity change, appetite change, chills, fatigue and fever.  HENT: Negative for congestion, ear pain, postnasal drip, rhinorrhea, sinus pain, sneezing, sore throat and voice change.   Respiratory: Negative for cough and wheezing.   Cardiovascular: Negative for chest pain and palpitations.  Gastrointestinal: Negative for abdominal pain, constipation, diarrhea, nausea and vomiting.  Endocrine: Negative for cold intolerance, heat intolerance, polydipsia and polyuria.  Genitourinary: Negative for dysuria, frequency, hematuria and urgency.  Musculoskeletal: Positive for arthralgias and myalgias.       Sees Dr. Gavin Potters, rheumatology for management. Recently started on Humira. Continues to take methotrexate and will take prednisone as needed for flares .  Skin: Negative for rash.  Allergic/Immunologic: Negative for environmental allergies.  Neurological: Negative for dizziness and headaches.  Hematological: Negative for adenopathy.  Psychiatric/Behavioral: Positive for dysphoric mood.  The patient is nervous/anxious.        Well managed on current medications.      Today's Vitals   01/21/19 0834  BP: 133/68  Pulse: 85  Resp: 16  SpO2: 98%  Weight: 240 lb 3.2 oz (109 kg)  Height: 5\' 8"  (1.727 m)   Body mass index is 36.52 kg/m.  Physical Exam Vitals signs and nursing note reviewed.   Constitutional:      General: She is not in acute distress.    Appearance: Normal appearance. She is well-developed. She is not diaphoretic.  HENT:     Head: Normocephalic and atraumatic.     Nose: No rhinorrhea.     Right Sinus: No frontal sinus tenderness.     Left Sinus: No frontal sinus tenderness.     Mouth/Throat:     Pharynx: No oropharyngeal exudate.  Eyes:     Pupils: Pupils are equal, round, and reactive to light.  Neck:     Musculoskeletal: Normal range of motion and neck supple.     Thyroid: No thyromegaly.     Vascular: No carotid bruit or JVD.     Trachea: No tracheal deviation.  Cardiovascular:     Rate and Rhythm: Normal rate and regular rhythm.     Pulses: Normal pulses.     Heart sounds: Normal heart sounds. No murmur. No friction rub. No gallop.   Pulmonary:     Effort: Pulmonary effort is normal. No respiratory distress.     Breath sounds: Normal breath sounds. No wheezing or rales.  Chest:     Chest wall: No tenderness.  Abdominal:     General: Bowel sounds are normal.     Palpations: Abdomen is soft.     Tenderness: There is no abdominal tenderness.  Genitourinary:    Vagina: Normal.     Comments: No tenderness, masses, or organomegaly present during bimanual exam. Musculoskeletal: Normal range of motion.  Lymphadenopathy:     Cervical: No cervical adenopathy.  Skin:    General: Skin is warm and dry.  Neurological:     Mental Status: She is alert and oriented to person, place, and time.     Cranial Nerves: No cranial nerve deficit.  Psychiatric:        Behavior: Behavior normal.        Thought Content: Thought content normal.        Judgment: Judgment normal.   Assessment/Plan: 1. Encounter for general adult medical examination with abnormal findings Annual wellness visit today.  2. Mixed hyperlipidemia Check fasting lipid panel. Adjust crestor dosing as indicated .  3. Moderate recurrent major depression (HCC) Well managed. Continue  medication as prescribed .  4. Nausea and vomiting, intractability of vomiting not specified, unspecified vomiting type Prescription given for transderm scop patch and instructions provided for use.  - scopolamine (TRANSDERM-SCOP, 1.5 MG,) 1 MG/3DAYS; Place 1 patch (1.5 mg total) onto the skin every 3 (three) days.  Dispense: 10 patch; Refill: 1  5. Screening for breast cancer - MM DIGITAL SCREENING BILATERAL; Future  6. Dysuria - Urinalysis, Routine w reflex microscopic  General Counseling: Letitia Neri understanding of the findings of todays visit and agrees with plan of treatment. I have discussed any further diagnostic evaluation that may be needed or ordered today. We also reviewed her medications today. she has been encouraged to call the office with any questions or concerns that should arise related to todays visit.    Counseling:  This patient was seen  by Vincent GrosHeather Chanika Byland FNP Collaboration with Dr Lyndon CodeFozia M Khan as a part of collaborative care agreement  Orders Placed This Encounter  Procedures  . MM DIGITAL SCREENING BILATERAL  . Urinalysis, Routine w reflex microscopic    Meds ordered this encounter  Medications  . scopolamine (TRANSDERM-SCOP, 1.5 MG,) 1 MG/3DAYS    Sig: Place 1 patch (1.5 mg total) onto the skin every 3 (three) days.    Dispense:  10 patch    Refill:  1    Order Specific Question:   Supervising Provider    Answer:   Lyndon CodeKHAN, FOZIA M [[id number]]    Time spent: 1430 Minutes      Lyndon CodeFozia M Khan, MD  Internal Medicine

## 2019-01-22 LAB — URINALYSIS, ROUTINE W REFLEX MICROSCOPIC
Bilirubin, UA: NEGATIVE
Glucose, UA: NEGATIVE
Ketones, UA: NEGATIVE
Leukocytes,UA: NEGATIVE
Nitrite, UA: NEGATIVE
Protein,UA: NEGATIVE
RBC, UA: NEGATIVE
Specific Gravity, UA: 1.02 (ref 1.005–1.030)
Urobilinogen, Ur: 0.2 mg/dL (ref 0.2–1.0)
pH, UA: 5 (ref 5.0–7.5)

## 2019-02-01 ENCOUNTER — Other Ambulatory Visit: Payer: Self-pay | Admitting: Pharmacist

## 2019-02-01 MED ORDER — ADALIMUMAB 40 MG/0.4ML ~~LOC~~ AJKT: 40.0000 mg | AUTO-INJECTOR | SUBCUTANEOUS | 5 refills | Status: DC

## 2019-02-01 MED FILL — HUMIRA PEN 40 MG/0.4ML PNKT: 40 | 28 days supply | Qty: 2 | Fill #0

## 2019-02-15 MED FILL — METHOTREXATE SODIUM 2.5 MG: 2.5 | 28 days supply | Qty: 28 | Fill #0

## 2019-02-25 ENCOUNTER — Ambulatory Visit: Payer: No Typology Code available for payment source | Attending: Family Medicine | Admitting: Pharmacist

## 2019-02-25 ENCOUNTER — Other Ambulatory Visit: Payer: Self-pay

## 2019-02-25 DIAGNOSIS — Z79899 Other long term (current) drug therapy: Secondary | ICD-10-CM

## 2019-02-25 NOTE — Progress Notes (Signed)
  S: Patient presents for review of their specialty medication therapy.  Patient is currently taking Humira for RA. Patient is managed by Dr. Kernodle for this.   Adherence: confirms  Efficacy: reports doing well  Dosing:  Rheumatoid arthritis: SubQ: 40 mg every other week (may continue methotrexate, other nonbiologic DMARDS, corticosteroids, NSAIDs, and/or analgesics); patients not taking concomitant methotrexate may increase dose to 40 mg every week  Dose adjustments: Renal: no dose adjustments (has not been studied) Hepatic: no dose adjustments (has not been studied)  Drug-drug interactions: no DDIs identified in regards to Humira. Of note, concurrent use of MTX and omeprazole may increase MTX concentrations and therefore ris of toxicity.   Screening: TB test: completed per patient Hepatitis: completed per patient   Monitoring: S/sx of infection: none CBC: regularly monitored per patient S/sx of hypersensitivity: none S/sx of malignancy: none S/sx of heart failure: none  Other side effects: none  O:  Lab Results  Component Value Date   WBC 9.1 06/15/2015   HGB 13.2 06/15/2015   HCT 39.7 06/15/2015   MCV 89.5 06/15/2015   PLT 264 06/15/2015      Chemistry      Component Value Date/Time   NA 139 01/21/2018 1131   K 3.7 01/21/2018 1131   CL 106 01/21/2018 1131   CO2 26 01/21/2018 1131   BUN 18 01/21/2018 1131   CREATININE 0.55 01/21/2018 1131      Component Value Date/Time   CALCIUM 9.2 01/21/2018 1131   ALKPHOS 104 01/21/2018 1131   AST 20 01/21/2018 1131   ALT 20 01/21/2018 1131   BILITOT 0.5 01/21/2018 1131      A/P: 1. Medication review: Patient currently on Humira for RA. Reviewed the medication with the patient, including the following: Humira is a TNF blocking agent indicated for ankylosing spondylitis, Crohn's disease, Hidradenitis suppurativa, psoriatic arthritis, plaque psoriasis, ulcerative colitis, and uveitis. Patient educated on purpose,  proper use and potential adverse effects of Humira. Possible adverse effects are increased risk of infections, headache, and injection site reactions. There is the possibility of an increased risk of malignancy but it is not well understood if this increased risk is due to there medication or the disease state. There are rare cases of pancytopenia and aplastic anemia. For SubQ injection at separate sites in the thigh or lower abdomen (avoiding areas within 2 inches of navel); rotate injection sites. May leave at room temperature for ~15 to 30 minutes prior to use; do not remove cap or cover while allowing product to reach room temperature. Do not use if solution is discolored or contains particulate matter. Do not administer to skin which is red, tender, bruised, hard, or that has scars, stretch marks, or psoriasis plaques. Needle cap of the prefilled syringe or needle cover for the adalimumab pen may contain latex. Prefilled pens and syringes are available for use by patients and the full amount of the syringe should be injected (self-administration); the vial is intended for institutional use only. Vials do not contain a preservative; discard unused portion. No recommendations for any changes at this time.   Luke Van Ausdall, PharmD, CPP Clinical Pharmacist [facility] [phone]          

## 2019-03-02 ENCOUNTER — Ambulatory Visit (INDEPENDENT_AMBULATORY_CARE_PROVIDER_SITE_OTHER): Payer: No Typology Code available for payment source | Admitting: Adult Health

## 2019-03-02 ENCOUNTER — Encounter: Payer: Self-pay | Admitting: Adult Health

## 2019-03-02 ENCOUNTER — Other Ambulatory Visit: Payer: Self-pay

## 2019-03-02 VITALS — BP 133/71 | HR 85 | Temp 96.8°F | Resp 16 | Ht 68.0 in | Wt 240.0 lb

## 2019-03-02 DIAGNOSIS — L209 Atopic dermatitis, unspecified: Secondary | ICD-10-CM | POA: Insufficient documentation

## 2019-03-02 DIAGNOSIS — L02619 Cutaneous abscess of unspecified foot: Secondary | ICD-10-CM

## 2019-03-02 DIAGNOSIS — L03119 Cellulitis of unspecified part of limb: Secondary | ICD-10-CM | POA: Insufficient documentation

## 2019-03-02 MED ORDER — PREDNISONE 10 MG (21) PO TBPK: ORAL_TABLET | ORAL | 0 refills | Status: DC

## 2019-03-02 MED ORDER — CEPHALEXIN 500 MG PO CAPS: 500.0000 mg | ORAL_CAPSULE | Freq: Three times a day (TID) | ORAL | 0 refills | Status: DC

## 2019-03-02 NOTE — Progress Notes (Signed)
[facility] [address] [place], [place] [address]  Internal MEDICINE  Office Visit Note  Patient Name: Karen Castro  [id number]  [id number]  Date of Service: 03/02/2019  Chief Complaint  Patient presents with  . Blister    left foot , bit on sunday by red ants      The patient is here for sick visit. She states that she believes she was bit by fire ants in between the great and second toe on the left foot. She has developed blistering and swelling where the bites are located. She states they itch very badly. Took shoes off while she was at work to apply some benadryl cream and foot was so swollen, she was unable to put her shoe back on. Foot feels very tight and swollen.   Pt is here for a sick visit.     Current Medication:  Outpatient Encounter Medications as of 03/02/2019  Medication Sig  . Adalimumab (HUMIRA PEN) 40 MG/0.4ML PNKT Inject 40 mg into the skin every 14 (fourteen) days. Inject 40 mg under the skin every 2 weeks as directed  . albuterol (PROVENTIL HFA;VENTOLIN HFA) 108 (90 Base) MCG/ACT inhaler Inhale 2 puffs into the lungs every 6 (six) hours as needed for wheezing or shortness of breath.  . Desvenlafaxine ER (PRISTIQ) 50 MG TB24 Take 3 tablets (150 mg total) by mouth daily.  . folic acid (FOLVITE) 1 MG tablet Take by mouth.  . loratadine (CLARITIN) 10 MG tablet Take 10 mg by mouth daily.  . methotrexate (RHEUMATREX) 2.5 MG tablet Take by mouth.  Marland Kitchen omeprazole (PRILOSEC) 20 MG capsule Take 1 capsule (20 mg total) by mouth daily.  . rosuvastatin (CRESTOR) 5 MG tablet Take 1 tablet (5 mg total) by mouth daily with supper.  Marland Kitchen scopolamine (TRANSDERM-SCOP, 1.5 MG,) 1 MG/3DAYS Place 1 patch (1.5 mg total) onto the skin every 3 (three) days.  . cephALEXin (KEFLEX) 500 MG capsule Take 1 capsule (500 mg total) by mouth 3 (three) times daily.  . predniSONE (STERAPRED UNI-PAK 21 TAB) 10 MG (21) TBPK tablet 6 day taper - take by mouth as directed for 6 days   . [DISCONTINUED] azithromycin (ZITHROMAX) 250 MG tablet z-pack - take as directed for 5 days (Patient not taking: Reported on 03/02/2019)  . [DISCONTINUED] guaiFENesin-codeine (ROBITUSSIN AC) 100-10 MG/5ML syrup Take 5 mLs by mouth at bedtime as needed for cough. (Patient not taking: Reported on 03/02/2019)  . [DISCONTINUED] oxyCODONE-acetaminophen (PERCOCET/ROXICET) 5-325 MG tablet Take 1 tablet by mouth every 8 (eight) hours as needed for severe pain. Do not drive while taking as can cause drowsiness. (Patient not taking: Reported on 03/02/2019)  . [DISCONTINUED] predniSONE (DELTASONE) 10 MG tablet Use per dose pack (Patient not taking: Reported on 03/02/2019)   No facility-administered encounter medications on file as of 03/02/2019.       Medical History: Past Medical History:  Diagnosis Date  . Arthritis   . Hypercholesteremia      Today's Vitals   03/02/19 0850  BP: 133/71  Pulse: 85  Resp: 16  Temp: (!) 96.8 F (36 C)  SpO2: 98%  Weight: 240 lb (108.9 kg)  Height: 5\' 8"  (1.727 m)   Body mass index is 36.49 kg/m.  Review of Systems  Constitutional: Negative for chills, fatigue, fever and unexpected weight change.  HENT: Negative for congestion, rhinorrhea, sneezing and sore throat.   Eyes: Negative for photophobia, pain and redness.  Respiratory: Negative for cough, chest tightness, shortness of breath  and wheezing.   Cardiovascular: Negative for chest pain and palpitations.  Gastrointestinal: Negative for abdominal pain, constipation, diarrhea, nausea and vomiting.  Musculoskeletal: Negative for arthralgias, back pain, joint swelling and neck pain.  Skin: Positive for wound. Negative for rash.       Blistering present in between the great and second toes of the left foot. Very itchy and swollen.   Allergic/Immunologic: Negative.   Neurological: Negative for tremors and numbness.  Hematological: Negative for adenopathy. Does not bruise/bleed easily.  Psychiatric/Behavioral:  Negative for behavioral problems and sleep disturbance. The patient is not nervous/anxious.     Physical Exam Vitals signs and nursing note reviewed.  Constitutional:      General: She is not in acute distress.    Appearance: Normal appearance. She is well-developed. She is not diaphoretic.  HENT:     Head: Normocephalic and atraumatic.     Mouth/Throat:     Pharynx: No oropharyngeal exudate.  Eyes:     Pupils: Pupils are equal, round, and reactive to light.  Neck:     Musculoskeletal: Normal range of motion and neck supple.     Thyroid: No thyromegaly.     Vascular: No JVD.     Trachea: No tracheal deviation.  Cardiovascular:     Rate and Rhythm: Normal rate and regular rhythm.     Heart sounds: Normal heart sounds. No murmur. No friction rub. No gallop.   Pulmonary:     Effort: Pulmonary effort is normal. No respiratory distress.     Breath sounds: No wheezing or rales.  Chest:     Chest wall: No tenderness.  Abdominal:     General: Bowel sounds are normal.     Palpations: Abdomen is soft.  Musculoskeletal: Normal range of motion.  Lymphadenopathy:     Cervical: No cervical adenopathy.  Skin:    General: Skin is warm and dry.     Findings: Erythema present.     Comments: Blistering in between the great and second toe of the left foot. There is moderate edema present in the distal aspect of the foot. There is no warmth with palpation. Currently skin intact with no drainage present.   Neurological:     Mental Status: She is alert and oriented to person, place, and time.     Cranial Nerves: No cranial nerve deficit.  Psychiatric:        Behavior: Behavior normal.        Thought Content: Thought content normal.        Judgment: Judgment normal.   Assessment/Plan: 1. Cellulitis and abscess of foot Start keflex 500mg  three times daily for next 10 days. Recommend epsom salt soaks for 20 minutes daily.   2. Atopic dermatitis, unspecified type Prednisone taper. Take by  mouth as directed for 6 days.  - cephALEXin (KEFLEX) 500 MG capsule; Take 1 capsule (500 mg total) by mouth 3 (three) times daily.  Dispense: 30 capsule; Refill: 0 - predniSONE (STERAPRED UNI-PAK 21 TAB) 10 MG (21) TBPK tablet; 6 day taper - take by mouth as directed for 6 days  Dispense: 21 tablet; Refill: 0  General Counseling: Lasharn verbalizes understanding of the findings of todays visit and agrees with plan of treatment. I have discussed any further diagnostic evaluation that may be needed or ordered today. We also reviewed her medications today. she has been encouraged to call the office with any questions or concerns that should arise related to todays visit.    Counseling:  This  patient was seen by Leretha Pol FNP Collaboration with Dr Lavera Guise as a part of collaborative care agreement  Meds ordered this encounter  Medications  . cephALEXin (KEFLEX) 500 MG capsule    Sig: Take 1 capsule (500 mg total) by mouth 3 (three) times daily.    Dispense:  30 capsule    Refill:  0    Order Specific Question:   Supervising Provider    Answer:   Lavera Guise [[id number]]  . predniSONE (STERAPRED UNI-PAK 21 TAB) 10 MG (21) TBPK tablet    Sig: 6 day taper - take by mouth as directed for 6 days    Dispense:  21 tablet    Refill:  0    Order Specific Question:   Supervising Provider    Answer:   Lavera Guise [[id number]]    Time spent: 15 Minutes

## 2019-03-16 MED FILL — METHOTREXATE SODIUM 2.5 MG: 2.5 | 28 days supply | Qty: 28 | Fill #1

## 2019-04-13 MED FILL — OMEPRAZOLE 20 MG CAP: 20 | 90 days supply | Qty: 90 | Fill #1

## 2019-04-13 MED FILL — METHOTREXATE SODIUM 2.5 MG: 2.5 | 28 days supply | Qty: 28 | Fill #2

## 2019-04-13 MED FILL — DESVENLAFAXINE SUC ER 50 MG: 50 | 90 days supply | Qty: 270 | Fill #1

## 2019-04-13 MED FILL — ROSUVASTATIN CALCIUM 5 MG T: 5 | 90 days supply | Qty: 90 | Fill #1

## 2019-04-16 MED FILL — HUMIRA PEN 40 MG/0.4ML PNKT: 40 | 28 days supply | Qty: 2 | Fill #1

## 2019-05-11 MED FILL — METHOTREXATE SODIUM 2.5 MG: 2.5 | 28 days supply | Qty: 28 | Fill #3

## 2019-05-11 MED FILL — FOLIC ACID 1 MG TABS: 1 | 90 days supply | Qty: 90 | Fill #2

## 2019-05-11 MED FILL — HUMIRA PEN 40 MG/0.4ML PNKT: 40 | 28 days supply | Qty: 2 | Fill #2

## 2019-06-14 MED FILL — METHOTREXATE SODIUM 2.5 MG: 2.5 | 28 days supply | Qty: 28 | Fill #4

## 2019-07-14 MED FILL — HUMIRA PEN 40 MG/0.4ML PNKT: 40 | 28 days supply | Qty: 2 | Fill #3

## 2019-07-14 MED FILL — METHOTREXATE SODIUM 2.5 MG: 2.5 | 28 days supply | Qty: 28 | Fill #5

## 2019-07-14 MED FILL — ROSUVASTATIN CALCIUM 5 MG T: 5 | 90 days supply | Qty: 90 | Fill #2

## 2019-07-14 MED FILL — OMEPRAZOLE 20 MG CAP: 20 | 90 days supply | Qty: 90 | Fill #2

## 2019-07-14 MED FILL — DESVENLAFAXINE SUC ER 50 MG: 50 | 90 days supply | Qty: 270 | Fill #2

## 2019-07-21 ENCOUNTER — Telehealth: Payer: Self-pay

## 2019-07-21 NOTE — Telephone Encounter (Signed)
CONFIRMED AND DID COVID SCREENING FOR 07-25-19 OV.

## 2019-07-25 ENCOUNTER — Encounter: Payer: Self-pay | Admitting: Nurse Practitioner

## 2019-07-25 ENCOUNTER — Ambulatory Visit (INDEPENDENT_AMBULATORY_CARE_PROVIDER_SITE_OTHER): Payer: No Typology Code available for payment source | Admitting: Nurse Practitioner

## 2019-07-25 ENCOUNTER — Other Ambulatory Visit: Payer: Self-pay

## 2019-07-25 VITALS — BP 135/68 | HR 75 | Temp 97.6°F | Resp 16 | Ht 68.0 in | Wt 243.0 lb

## 2019-07-25 DIAGNOSIS — E782 Mixed hyperlipidemia: Secondary | ICD-10-CM | POA: Diagnosis not present

## 2019-07-25 DIAGNOSIS — K219 Gastro-esophageal reflux disease without esophagitis: Secondary | ICD-10-CM | POA: Diagnosis not present

## 2019-07-25 DIAGNOSIS — J4520 Mild intermittent asthma, uncomplicated: Secondary | ICD-10-CM | POA: Diagnosis not present

## 2019-07-25 DIAGNOSIS — F331 Major depressive disorder, recurrent, moderate: Secondary | ICD-10-CM | POA: Diagnosis not present

## 2019-07-25 MED ORDER — OMEPRAZOLE 20 MG PO CPDR: 20.0000 mg | DELAYED_RELEASE_CAPSULE | Freq: Every day | ORAL | 3 refills | Status: DC

## 2019-07-25 MED ORDER — ALBUTEROL SULFATE HFA 108 (90 BASE) MCG/ACT IN AERS: 2.0000 | INHALATION_SPRAY | Freq: Four times a day (QID) | RESPIRATORY_TRACT | 1 refills | Status: DC | PRN

## 2019-07-25 MED ORDER — ROSUVASTATIN CALCIUM 5 MG PO TABS: 5.0000 mg | ORAL_TABLET | Freq: Every day | ORAL | 3 refills | Status: DC

## 2019-07-25 NOTE — Progress Notes (Signed)
[facility] [place], [place] [address]  Internal MEDICINE  Office Visit Note  Patient Name: Karen Castro  [id number]  [id number]  Date of Service: 07/25/2019  Chief Complaint  Patient presents with  . Hyperlipidemia  . Arthritis    The patient is here for routine follow up. She has no concerns or complaints. Is fatigued. Working many hours and getting overtime every week. Working nightshift and taking care of her mother. Difficulty losing weight is only problem she expresses. States that she just has problems finding time to add in exercise.       Current Medication: Outpatient Encounter Medications as of 07/25/2019  Medication Sig  . Adalimumab (HUMIRA PEN) 40 MG/0.4ML PNKT Inject 40 mg into the skin every 14 (fourteen) days. Inject 40 mg under the skin every 2 weeks as directed  . albuterol (VENTOLIN HFA) 108 (90 Base) MCG/ACT inhaler Inhale 2 puffs into the lungs every 6 (six) hours as needed for wheezing or shortness of breath.  . Desvenlafaxine ER (PRISTIQ) 50 MG TB24 Take 3 tablets (150 mg total) by mouth daily.  . folic acid (FOLVITE) 1 MG tablet Take 1 mg by mouth daily.   Marland Kitchen loratadine (CLARITIN) 10 MG tablet Take 10 mg by mouth daily.  . methotrexate (RHEUMATREX) 2.5 MG tablet Take by mouth once a week.   Marland Kitchen omeprazole (PRILOSEC) 20 MG capsule Take 1 capsule (20 mg total) by mouth daily.  . rosuvastatin (CRESTOR) 5 MG tablet Take 1 tablet (5 mg total) by mouth daily with supper.  . [DISCONTINUED] albuterol (PROVENTIL HFA;VENTOLIN HFA) 108 (90 Base) MCG/ACT inhaler Inhale 2 puffs into the lungs every 6 (six) hours as needed for wheezing or shortness of breath.  . [DISCONTINUED] omeprazole (PRILOSEC) 20 MG capsule Take 1 capsule (20 mg total) by mouth daily.  . [DISCONTINUED] rosuvastatin (CRESTOR) 5 MG tablet Take 1 tablet (5 mg total) by mouth daily with supper.  . [DISCONTINUED] cephALEXin (KEFLEX) 500 MG capsule Take 1 capsule (500 mg  total) by mouth 3 (three) times daily. (Patient not taking: Reported on 07/25/2019)  . [DISCONTINUED] predniSONE (STERAPRED UNI-PAK 21 TAB) 10 MG (21) TBPK tablet 6 day taper - take by mouth as directed for 6 days (Patient not taking: Reported on 07/25/2019)  . [DISCONTINUED] scopolamine (TRANSDERM-SCOP, 1.5 MG,) 1 MG/3DAYS Place 1 patch (1.5 mg total) onto the skin every 3 (three) days. (Patient not taking: Reported on 07/25/2019)   No facility-administered encounter medications on file as of 07/25/2019.     Surgical History: Past Surgical History:  Procedure Laterality Date  . ABDOMINAL HYSTERECTOMY    . BREAST BIOPSY Right    core- neg    Medical History: Past Medical History:  Diagnosis Date  . Arthritis   . Hypercholesteremia     Family History: Family History  Problem Relation Age of Onset  . Breast cancer Maternal Grandmother 74    Social History   Socioeconomic History  . Marital status: Married    Spouse name: Not on file  . Number of children: Not on file  . Years of education: Not on file  . Highest education level: Not on file  Occupational History  . Not on file  Social Needs  . Financial resource strain: Not on file  . Food insecurity    Worry: Not on file    Inability: Not on file  . Transportation needs    Medical: Not on file    Non-medical: Not on file  Tobacco Use  . Smoking status: Current Every Day Smoker    Types: Cigarettes  . Smokeless tobacco: Never Used  Substance and Sexual Activity  . Alcohol use: Yes    Comment: socially  . Drug use: No  . Sexual activity: Not on file  Lifestyle  . Physical activity    Days per week: Not on file    Minutes per session: Not on file  . Stress: Not on file  Relationships  . Social Musician on phone: Not on file    Gets together: Not on file    Attends religious service: Not on file    Active member of club or organization: Not on file    Attends meetings of clubs or organizations:  Not on file    Relationship status: Not on file  . Intimate partner violence    Fear of current or ex partner: Not on file    Emotionally abused: Not on file    Physically abused: Not on file    Forced sexual activity: Not on file  Other Topics Concern  . Not on file  Social History Narrative  . Not on file      Review of Systems  Constitutional: Positive for fatigue. Negative for activity change, appetite change, chills and fever.  HENT: Negative for congestion, ear pain, postnasal drip, rhinorrhea, sinus pain, sneezing, sore throat and voice change.   Respiratory: Negative for cough and wheezing.   Cardiovascular: Negative for chest pain and palpitations.  Gastrointestinal: Negative for abdominal pain, constipation, diarrhea, nausea and vomiting.  Endocrine: Negative for cold intolerance, heat intolerance, polydipsia and polyuria.  Musculoskeletal: Positive for arthralgias and myalgias.       Sees Dr. Gavin Potters, rheumatology for management. Recently started on Humira. Continues to take methotrexate and will take prednisone as needed for flares .  Skin: Negative for rash.  Allergic/Immunologic: Negative for environmental allergies.  Neurological: Negative for dizziness and headaches.  Hematological: Negative for adenopathy.  Psychiatric/Behavioral: Positive for dysphoric mood. The patient is nervous/anxious.        Well managed on current medications.     Today's Vitals   07/25/19 0835  BP: 135/68  Pulse: 75  Resp: 16  Temp: 97.6 F (36.4 C)  SpO2: 97%  Weight: 243 lb (110.2 kg)  Height: 5\' 8"  (1.727 m)   Body mass index is 36.95 kg/m.  Physical Exam Vitals signs and nursing note reviewed.  Constitutional:      General: She is not in acute distress.    Appearance: Normal appearance. She is well-developed. She is not diaphoretic.  HENT:     Head: Normocephalic and atraumatic.     Mouth/Throat:     Pharynx: No oropharyngeal exudate.  Eyes:     Pupils: Pupils are  equal, round, and reactive to light.  Neck:     Musculoskeletal: Normal range of motion and neck supple.     Thyroid: No thyromegaly.     Vascular: No JVD.     Trachea: No tracheal deviation.  Cardiovascular:     Rate and Rhythm: Normal rate and regular rhythm.     Heart sounds: Normal heart sounds. No murmur. No friction rub. No gallop.   Pulmonary:     Effort: Pulmonary effort is normal. No respiratory distress.     Breath sounds: Normal breath sounds. No wheezing or rales.  Chest:     Chest wall: No tenderness.  Abdominal:     Palpations: Abdomen is soft.  Musculoskeletal: Normal range of motion.  Lymphadenopathy:     Cervical: No cervical adenopathy.  Skin:    General: Skin is warm and dry.  Neurological:     Mental Status: She is alert and oriented to person, place, and time.     Cranial Nerves: No cranial nerve deficit.  Psychiatric:        Mood and Affect: Mood normal.        Behavior: Behavior normal.        Thought Content: Thought content normal.        Judgment: Judgment normal.    Assessment/Plan: 1. Mild intermittent asthma without complication May use albuterol inhaler - use as needed and as directed  - albuterol (VENTOLIN HFA) 108 (90 Base) MCG/ACT inhaler; Inhale 2 puffs into the lungs every 6 (six) hours as needed for wheezing or shortness of breath.  Dispense: 18 g; Refill: 1  2. Gastro-esophageal reflux disease without esophagitis - omeprazole (PRILOSEC) 20 MG capsule; Take 1 capsule (20 mg total) by mouth daily.  Dispense: 90 capsule; Refill: 3  3. Mixed hyperlipidemia Check fasting lipid panel and adjust dose of crestor as indicated  - rosuvastatin (CRESTOR) 5 MG tablet; Take 1 tablet (5 mg total) by mouth daily with supper.  Dispense: 90 tablet; Refill: 3  4. Moderate recurrent major depression (HCC) Well managed. Continue current dose pristiq.   General Counseling: tareka jhaveri understanding of the findings of todays visit and agrees with plan  of treatment. I have discussed any further diagnostic evaluation that may be needed or ordered today. We also reviewed her medications today. she has been encouraged to call the office with any questions or concerns that should arise related to todays visit.   This patient was seen by Vincent Gros FNP Collaboration with Dr Lyndon Code as a part of collaborative care agreement  Meds ordered this encounter  Medications  . omeprazole (PRILOSEC) 20 MG capsule    Sig: Take 1 capsule (20 mg total) by mouth daily.    Dispense:  90 capsule    Refill:  3    Order Specific Question:   Supervising Provider    Answer:   Lyndon Code [[id number]]  . rosuvastatin (CRESTOR) 5 MG tablet    Sig: Take 1 tablet (5 mg total) by mouth daily with supper.    Dispense:  90 tablet    Refill:  3    Order Specific Question:   Supervising Provider    Answer:   Lyndon Code [[id number]]  . albuterol (VENTOLIN HFA) 108 (90 Base) MCG/ACT inhaler    Sig: Inhale 2 puffs into the lungs every 6 (six) hours as needed for wheezing or shortness of breath.    Dispense:  18 g    Refill:  1    Order Specific Question:   Supervising Provider    Answer:   Lyndon Code [[id number]]    Time spent: 62 Minutes      Dr Lyndon Code Internal medicine

## 2019-08-11 MED FILL — FOLIC ACID 1 MG TABS: 1 | 90 days supply | Qty: 90 | Fill #0

## 2019-08-11 MED FILL — METHOTREXATE SODIUM 2.5 MG: 2.5 | 28 days supply | Qty: 28 | Fill #6

## 2019-08-11 MED FILL — HUMIRA PEN 40 MG/0.4ML PNKT: 40 | 28 days supply | Qty: 2 | Fill #4

## 2019-08-17 ENCOUNTER — Other Ambulatory Visit: Payer: Self-pay | Admitting: Nurse Practitioner

## 2019-08-17 DIAGNOSIS — B372 Candidiasis of skin and nail: Secondary | ICD-10-CM

## 2019-08-17 MED ORDER — NYSTATIN 100000 UNIT/GM EX POWD: Freq: Four times a day (QID) | CUTANEOUS | 2 refills | Status: AC

## 2019-08-17 NOTE — Progress Notes (Signed)
Sent prescription for ystatin powder. May apply to affected area three to four times daily as needed. Sent to [facility].

## 2019-08-22 ENCOUNTER — Other Ambulatory Visit: Payer: Self-pay | Admitting: Pharmacist

## 2019-08-22 MED ORDER — HUMIRA (2 PEN) 40 MG/0.4ML ~~LOC~~ AJKT: 40.0000 mg | AUTO-INJECTOR | SUBCUTANEOUS | 1 refills | Status: DC

## 2019-09-08 ENCOUNTER — Ambulatory Visit (INDEPENDENT_AMBULATORY_CARE_PROVIDER_SITE_OTHER): Payer: 59 | Admitting: Nurse Practitioner

## 2019-09-08 ENCOUNTER — Encounter: Payer: Self-pay | Admitting: Nurse Practitioner

## 2019-09-08 ENCOUNTER — Other Ambulatory Visit: Payer: Self-pay

## 2019-09-08 VITALS — Ht 68.0 in

## 2019-09-08 DIAGNOSIS — J069 Acute upper respiratory infection, unspecified: Secondary | ICD-10-CM

## 2019-09-08 DIAGNOSIS — J4520 Mild intermittent asthma, uncomplicated: Secondary | ICD-10-CM | POA: Diagnosis not present

## 2019-09-08 MED ORDER — AZITHROMYCIN 250 MG PO TABS: ORAL_TABLET | ORAL | 1 refills | Status: DC

## 2019-09-08 NOTE — Progress Notes (Signed)
[facility] [place], [place] [address]  Internal MEDICINE  Telephone Visit  Patient Name: Karen Castro  [id number]  [id number]  Date of Service: 09/08/2019  I connected with the patient at 12:36pm  by webcam and verified the patients identity using two identifiers.   I discussed the limitations, risks, security and privacy concerns of performing an evaluation and management service by telephone and the availability of in person appointments. I also discussed with the patient that there may be a patient responsible charge related to the service.  The patient expressed understanding and agrees to proceed.    Chief Complaint  Patient presents with  . Telephone Assessment  . Telephone Screen  . Cough    MUCOUS LOOSENS UP BUT CANT COUGH ANYTHING UP   . Wheezing    WHEN LAYING DOWN    The patient has been contacted via webcam for follow up visit due to concerns for spread of novel coronavirus. The patient presents for acute visit. The patient states that she has a cough with bronchitis like symptoms. Has been taking mucinex for past two weeks. She has been using her rescue inhaler which does seem to help wheezing at night. She denies fever, sore throat, body or muscle aches.       Current Medication: Outpatient Encounter Medications as of 09/08/2019  Medication Sig  . Adalimumab (HUMIRA PEN) 40 MG/0.4ML PNKT Inject 40 mg into the skin every 14 (fourteen) days.  Marland Kitchen albuterol (VENTOLIN HFA) 108 (90 Base) MCG/ACT inhaler Inhale 2 puffs into the lungs every 6 (six) hours as needed for wheezing or shortness of breath.  . Desvenlafaxine ER (PRISTIQ) 50 MG TB24 Take 3 tablets (150 mg total) by mouth daily.  . folic acid (FOLVITE) 1 MG tablet Take 1 mg by mouth daily.   Marland Kitchen loratadine (CLARITIN) 10 MG tablet Take 10 mg by mouth daily.  . methotrexate (RHEUMATREX) 2.5 MG tablet Take by mouth once a week.   . nystatin (MYCOSTATIN/NYSTOP) powder Apply topically 4 (four)  times daily.  Marland Kitchen omeprazole (PRILOSEC) 20 MG capsule Take 1 capsule (20 mg total) by mouth daily.  . rosuvastatin (CRESTOR) 5 MG tablet Take 1 tablet (5 mg total) by mouth daily with supper.  Marland Kitchen azithromycin (ZITHROMAX) 250 MG tablet z-pack - take as directed for 5 days for URI   No facility-administered encounter medications on file as of 09/08/2019.    Surgical History: Past Surgical History:  Procedure Laterality Date  . ABDOMINAL HYSTERECTOMY    . BREAST BIOPSY Right    core- neg    Medical History: Past Medical History:  Diagnosis Date  . Arthritis   . Hypercholesteremia     Family History: Family History  Problem Relation Age of Onset  . Breast cancer Maternal Grandmother 74    Social History   Socioeconomic History  . Marital status: Married    Spouse name: Not on file  . Number of children: Not on file  . Years of education: Not on file  . Highest education level: Not on file  Occupational History  . Not on file  Tobacco Use  . Smoking status: Current Every Day Smoker    Types: Cigarettes  . Smokeless tobacco: Never Used  Substance and Sexual Activity  . Alcohol use: Yes    Comment: socially  . Drug use: No  . Sexual activity: Not on file  Other Topics Concern  . Not on file  Social History Narrative  . Not on  file   Social Determinants of Health   Financial Resource Strain:   . Difficulty of Paying Living Expenses: Not on file  Food Insecurity:   . Worried About Programme researcher, broadcasting/film/video in the Last Year: Not on file  . Ran Out of Food in the Last Year: Not on file  Transportation Needs:   . Lack of Transportation (Medical): Not on file  . Lack of Transportation (Non-Medical): Not on file  Physical Activity:   . Days of Exercise per Week: Not on file  . Minutes of Exercise per Session: Not on file  Stress:   . Feeling of Stress : Not on file  Social Connections:   . Frequency of Communication with Friends and Family: Not on file  . Frequency of  Social Gatherings with Friends and Family: Not on file  . Attends Religious Services: Not on file  . Active Member of Clubs or Organizations: Not on file  . Attends Banker Meetings: Not on file  . Marital Status: Not on file  Intimate Partner Violence:   . Fear of Current or Ex-Partner: Not on file  . Emotionally Abused: Not on file  . Physically Abused: Not on file  . Sexually Abused: Not on file      Review of Systems  Constitutional: Positive for fatigue. Negative for activity change, chills and fever.  HENT: Positive for congestion, postnasal drip and rhinorrhea. Negative for sore throat.   Respiratory: Positive for cough and wheezing.        Most severe at night.   Gastrointestinal: Negative for nausea and vomiting.  Musculoskeletal: Negative for arthralgias and myalgias.  Allergic/Immunologic: Positive for environmental allergies.  Neurological: Positive for headaches.  Hematological: Negative for adenopathy.    Today's Vitals   09/08/19 1221  Height: 5\' 8"  (1.727 m)   Body mass index is 36.95 kg/m.  Observation/Objective:   The patient is alert and oriented. She is pleasant and answers all questions appropriately. Breathing is non-labored. She is in no acute distress at this time.  The patient is nasally congested. She has deep, congested sounding cough.    Assessment/Plan: 1. Acute upper respiratory infection Start z-pack. Take as directed for 5 days. May repeat once if needed. She should continue to take mucinex and OTC cough suppressant to improve acute symptoms  - azithromycin (ZITHROMAX) 250 MG tablet; z-pack - take as directed for 5 days for URI  Dispense: 6 tablet; Refill: 1  2. Mild intermittent asthma without complication May use rescue inhaler as needed and as prescribed for wheezing/cough.   General Counseling: mazal ebey understanding of the findings of today's phone visit and agrees with plan of treatment. I have discussed any  further diagnostic evaluation that may be needed or ordered today. We also reviewed her medications today. she has been encouraged to call the office with any questions or concerns that should arise related to todays visit.   Rest and increase fluids. Continue using OTC medication to control symptoms.   This patient was seen by Letitia Neri FNP Collaboration with Dr Vincent Gros as a part of collaborative care agreement  Meds ordered this encounter  Medications  . azithromycin (ZITHROMAX) 250 MG tablet    Sig: z-pack - take as directed for 5 days for URI    Dispense:  6 tablet    Refill:  1    Order Specific Question:   Supervising Provider    Answer:   Lyndon Code [[id number]]  Time spent: 37 Minutes    Dr Lavera Guise Internal medicine

## 2019-09-13 MED FILL — HUMIRA PEN 40 MG/0.4ML PNKT: 40 | 28 days supply | Qty: 2 | Fill #0

## 2019-09-17 MED FILL — METHOTREXATE SODIUM 2.5 MG: 2.5 | 84 days supply | Qty: 84 | Fill #0

## 2019-09-27 ENCOUNTER — Inpatient Hospital Stay: Admission: RE | Admit: 2019-09-27 | Payer: No Typology Code available for payment source | Source: Ambulatory Visit

## 2019-09-28 ENCOUNTER — Encounter: Payer: Self-pay | Admitting: Adult Health

## 2019-09-28 ENCOUNTER — Ambulatory Visit
Admission: RE | Admit: 2019-09-28 | Discharge: 2019-09-28 | Disposition: A | Discharge status: Home / Self Care | Payer: 59 | Source: Ambulatory Visit | Attending: Adult Health | Admitting: Adult Health

## 2019-09-28 ENCOUNTER — Other Ambulatory Visit: Payer: Self-pay

## 2019-09-28 ENCOUNTER — Ambulatory Visit: Payer: 59 | Admitting: Adult Health

## 2019-09-28 VITALS — BP 144/80 | HR 91 | Temp 97.9°F | Ht 68.0 in | Wt 230.0 lb

## 2019-09-28 DIAGNOSIS — R0989 Other specified symptoms and signs involving the circulatory and respiratory systems: Secondary | ICD-10-CM | POA: Diagnosis not present

## 2019-09-28 DIAGNOSIS — R05 Cough: Secondary | ICD-10-CM

## 2019-09-28 DIAGNOSIS — R059 Cough, unspecified: Secondary | ICD-10-CM

## 2019-09-28 MED ORDER — PREDNISONE 10 MG PO TABS: ORAL_TABLET | ORAL | 0 refills | Status: DC

## 2019-09-28 NOTE — Progress Notes (Signed)
[facility] [place], [place] [address]  Internal MEDICINE  Telephone Visit  Patient Name: Karen Castro  [id number]  [id number]  Date of Service: 09/28/2019  I connected with the patient at 1102 by telephone and verified the patients identity using two identifiers.   I discussed the limitations, risks, security and privacy concerns of performing an evaluation and management service by telephone and the availability of in person appointments. I also discussed with the patient that there may be a patient responsible charge related to the service.  The patient expressed understanding and agrees to proceed.    Chief Complaint  Patient presents with  . Telephone Assessment    TESTED POSITIVE FOR COVID   . Telephone Screen  . CHEST CONGESTION    POSS CHEST XRAY, HAS TAKEN TWO ROUNDS OF ZPAK, HER WORK ASKED TO GET PREDNISONE, CANNOT COUGH MUCH UP, BUT IS YELLOW PHLEGM IF SHE CAN, HAS TAKEN MUCINEX AND OTC MEDS  . Wheezing    HAPPENS A LITTLE WHEN SHE LAYS DOWN  . Nasal Congestion    BLOW NOSE AND MUCOUS IS CLEAR    HPI  Pt seen via video.  She reports testing positive for covid-19 8 days ago.  Her symptoms started 2 days before that.  She and her husband were both positive.  She has taken two courses of Azithromycin in the last few weeks.  Her symptoms have improved some.  She continues to have cough/chest congestion.  She is unable to cough anything up at this time.  She denies fever, body aches, and her nasal mucous is clear.  She spoke with work today, and they would like her to have some further work up and medication.     Current Medication: Outpatient Encounter Medications as of 09/28/2019  Medication Sig  . Adalimumab (HUMIRA PEN) 40 MG/0.4ML PNKT Inject 40 mg into the skin every 14 (fourteen) days.  Marland Kitchen albuterol (VENTOLIN HFA) 108 (90 Base) MCG/ACT inhaler Inhale 2 puffs into the lungs every 6 (six) hours as needed for wheezing or shortness of breath.   . Desvenlafaxine ER (PRISTIQ) 50 MG TB24 Take 3 tablets (150 mg total) by mouth daily.  . folic acid (FOLVITE) 1 MG tablet Take 1 mg by mouth daily.   Marland Kitchen loratadine (CLARITIN) 10 MG tablet Take 10 mg by mouth daily.  . methotrexate (RHEUMATREX) 2.5 MG tablet Take by mouth once a week.   . nystatin (MYCOSTATIN/NYSTOP) powder Apply topically 4 (four) times daily.  Marland Kitchen omeprazole (PRILOSEC) 20 MG capsule Take 1 capsule (20 mg total) by mouth daily.  . rosuvastatin (CRESTOR) 5 MG tablet Take 1 tablet (5 mg total) by mouth daily with supper.  . predniSONE (DELTASONE) 10 MG tablet Use per dose pack  . [DISCONTINUED] azithromycin (ZITHROMAX) 250 MG tablet z-pack - take as directed for 5 days for URI (Patient not taking: Reported on 09/28/2019)   No facility-administered encounter medications on file as of 09/28/2019.    Surgical History: Past Surgical History:  Procedure Laterality Date  . ABDOMINAL HYSTERECTOMY    . BREAST BIOPSY Right    core- neg    Medical History: Past Medical History:  Diagnosis Date  . Arthritis   . Hypercholesteremia     Family History: Family History  Problem Relation Age of Onset  . Breast cancer Maternal Grandmother 74    Social History   Socioeconomic History  . Marital status: Married    Spouse name: Not on file  . Number  of children: Not on file  . Years of education: Not on file  . Highest education level: Not on file  Occupational History  . Not on file  Tobacco Use  . Smoking status: Current Every Day Smoker    Packs/day: 1.00    Types: Cigarettes  . Smokeless tobacco: Never Used  . Tobacco comment: HAS TRIED TO QUIT   Substance and Sexual Activity  . Alcohol use: Yes    Comment: socially  . Drug use: No  . Sexual activity: Not on file  Other Topics Concern  . Not on file  Social History Narrative  . Not on file   Social Determinants of Health   Financial Resource Strain:   . Difficulty of Paying Living Expenses: Not on file   Food Insecurity:   . Worried About Programme researcher, broadcasting/film/video in the Last Year: Not on file  . Ran Out of Food in the Last Year: Not on file  Transportation Needs:   . Lack of Transportation (Medical): Not on file  . Lack of Transportation (Non-Medical): Not on file  Physical Activity:   . Days of Exercise per Week: Not on file  . Minutes of Exercise per Session: Not on file  Stress:   . Feeling of Stress : Not on file  Social Connections:   . Frequency of Communication with Friends and Family: Not on file  . Frequency of Social Gatherings with Friends and Family: Not on file  . Attends Religious Services: Not on file  . Active Member of Clubs or Organizations: Not on file  . Attends Banker Meetings: Not on file  . Marital Status: Not on file  Intimate Partner Violence:   . Fear of Current or Ex-Partner: Not on file  . Emotionally Abused: Not on file  . Physically Abused: Not on file  . Sexually Abused: Not on file      Review of Systems  Constitutional: Negative for chills, fatigue and unexpected weight change.  HENT: Negative for congestion, rhinorrhea, sneezing and sore throat.   Eyes: Negative for photophobia, pain and redness.  Respiratory: Negative for cough, chest tightness and shortness of breath.   Cardiovascular: Negative for chest pain and palpitations.  Gastrointestinal: Negative for abdominal pain, constipation, diarrhea, nausea and vomiting.  Endocrine: Negative.   Genitourinary: Negative for dysuria and frequency.  Musculoskeletal: Negative for arthralgias, back pain, joint swelling and neck pain.  Skin: Negative for rash.  Allergic/Immunologic: Negative.   Neurological: Negative for tremors and numbness.  Hematological: Negative for adenopathy. Does not bruise/bleed easily.  Psychiatric/Behavioral: Negative for behavioral problems and sleep disturbance. The patient is not nervous/anxious.     Vital Signs: BP (!) 144/80   Pulse 91   Temp 97.9 F  (36.6 C)   Ht 5\' 8"  (1.727 m)   Wt 230 lb (104.3 kg)   BMI 34.97 kg/m    Observation/Objective:  Well appearing, some coughing noted.     Assessment/Plan: 1. Chest congestion Use prednisone as directed, follow up as discussed if symptoms worsen or fail to improve.  - predniSONE (DELTASONE) 10 MG tablet; Use per dose pack  Dispense: 21 tablet; Refill: 0  2. Cough Get CXR to rule out PNA. - DG Chest 2 View; Future  General Counseling: ronnetta currington understanding of the findings of today's phone visit and agrees with plan of treatment. I have discussed any further diagnostic evaluation that may be needed or ordered today. We also reviewed her medications today. she has  been encouraged to call the office with any questions or concerns that should arise related to todays visit.    Orders Placed This Encounter  Procedures  . DG Chest 2 View    Meds ordered this encounter  Medications  . predniSONE (DELTASONE) 10 MG tablet    Sig: Use per dose pack    Dispense:  21 tablet    Refill:  0    Time spent: 15 Minutes    Blima Ledger AGNP-C Internal medicine

## 2019-10-19 ENCOUNTER — Other Ambulatory Visit: Payer: Self-pay

## 2019-10-19 DIAGNOSIS — F331 Major depressive disorder, recurrent, moderate: Secondary | ICD-10-CM

## 2019-10-19 DIAGNOSIS — E782 Mixed hyperlipidemia: Secondary | ICD-10-CM

## 2019-10-19 DIAGNOSIS — K219 Gastro-esophageal reflux disease without esophagitis: Secondary | ICD-10-CM

## 2019-10-19 MED ORDER — OMEPRAZOLE 20 MG PO CPDR: 20.0000 mg | DELAYED_RELEASE_CAPSULE | Freq: Every day | ORAL | 1 refills | Status: DC

## 2019-10-19 MED ORDER — ROSUVASTATIN CALCIUM 5 MG PO TABS: 5.0000 mg | ORAL_TABLET | Freq: Every day | ORAL | 1 refills | Status: DC

## 2019-10-19 MED ORDER — DESVENLAFAXINE ER 50 MG PO TB24: 150.0000 mg | ORAL_TABLET | Freq: Every day | ORAL | 1 refills | Status: DC

## 2019-10-19 MED FILL — OMEPRAZOLE 20 MG CAP: 20 | 90 days supply | Qty: 90 | Fill #0

## 2019-10-19 MED FILL — ROSUVASTATIN CALCIUM 5 MG T: 5 | 90 days supply | Qty: 90 | Fill #0

## 2019-10-19 MED FILL — DESVENLAFAXINE SUC ER 50 MG: 50 | 90 days supply | Qty: 270 | Fill #0

## 2019-10-24 MED FILL — HUMIRA PEN 40 MG/0.4ML PNKT: 40 | 28 days supply | Qty: 2 | Fill #1

## 2019-11-09 ENCOUNTER — Other Ambulatory Visit: Payer: Self-pay | Admitting: Nurse Practitioner

## 2019-11-09 DIAGNOSIS — Z0000 Encounter for general adult medical examination without abnormal findings: Secondary | ICD-10-CM | POA: Diagnosis not present

## 2019-11-09 DIAGNOSIS — E559 Vitamin D deficiency, unspecified: Secondary | ICD-10-CM | POA: Diagnosis not present

## 2019-11-09 DIAGNOSIS — E7800 Pure hypercholesterolemia, unspecified: Secondary | ICD-10-CM | POA: Diagnosis not present

## 2019-11-10 LAB — LIPID PANEL W/O CHOL/HDL RATIO
Cholesterol, Total: 203 mg/dL — ABNORMAL HIGH (ref 100–199)
HDL: 45 mg/dL (ref >39)
LDL Chol Calc (NIH): 117 mg/dL — ABNORMAL HIGH (ref 0–99)
Triglycerides: 232 mg/dL — ABNORMAL HIGH (ref 0–149)
VLDL Cholesterol Cal: 41 mg/dL — ABNORMAL HIGH (ref 5–40)

## 2019-11-10 LAB — TSH+FREE T4
Free T4: 0.83 ng/dL (ref 0.82–1.77)
TSH: 1.91 u[IU]/mL (ref 0.450–4.500)

## 2019-11-10 LAB — VITAMIN D 25 HYDROXY (VIT D DEFICIENCY, FRACTURES): Vit D, 25-Hydroxy: 13.5 ng/mL — ABNORMAL LOW (ref 30.0–100.0)

## 2019-11-10 MED FILL — FOLIC ACID 1 MG TABS: 1 | 90 days supply | Qty: 90 | Fill #1

## 2019-11-15 ENCOUNTER — Ambulatory Visit
Admission: RE | Admit: 2019-11-15 | Discharge: 2019-11-15 | Disposition: A | Discharge status: Home / Self Care | Payer: 59 | Source: Ambulatory Visit | Attending: Nurse Practitioner | Admitting: Nurse Practitioner

## 2019-11-15 ENCOUNTER — Other Ambulatory Visit: Payer: Self-pay

## 2019-11-15 DIAGNOSIS — Z1231 Encounter for screening mammogram for malignant neoplasm of breast: Secondary | ICD-10-CM | POA: Insufficient documentation

## 2019-11-15 DIAGNOSIS — Z1239 Encounter for other screening for malignant neoplasm of breast: Secondary | ICD-10-CM

## 2019-11-15 NOTE — Progress Notes (Signed)
Negative mammogram

## 2019-11-16 NOTE — Progress Notes (Signed)
Low vitamin d. Lipid panel much improved. Discuss with patient at visit 11/22/2019

## 2019-11-18 ENCOUNTER — Telehealth: Payer: Self-pay

## 2019-11-18 NOTE — Telephone Encounter (Signed)
CONFIRMED AND SCREENED FOR 11-22-19 OV. 

## 2019-11-21 MED FILL — HUMIRA PEN 40 MG/0.4ML PNKT: 40 | 28 days supply | Qty: 2 | Fill #2

## 2019-11-22 ENCOUNTER — Encounter: Payer: Self-pay | Admitting: Nurse Practitioner

## 2019-11-22 ENCOUNTER — Ambulatory Visit (INDEPENDENT_AMBULATORY_CARE_PROVIDER_SITE_OTHER): Payer: 59 | Admitting: Nurse Practitioner

## 2019-11-22 ENCOUNTER — Other Ambulatory Visit: Payer: Self-pay

## 2019-11-22 ENCOUNTER — Other Ambulatory Visit: Payer: Self-pay | Admitting: Nurse Practitioner

## 2019-11-22 VITALS — BP 146/66 | HR 69 | Temp 97.1°F | Resp 16 | Ht 68.0 in | Wt 242.2 lb

## 2019-11-22 DIAGNOSIS — E559 Vitamin D deficiency, unspecified: Secondary | ICD-10-CM

## 2019-11-22 DIAGNOSIS — M0579 Rheumatoid arthritis with rheumatoid factor of multiple sites without organ or systems involvement: Secondary | ICD-10-CM | POA: Diagnosis not present

## 2019-11-22 DIAGNOSIS — Z79899 Other long term (current) drug therapy: Secondary | ICD-10-CM | POA: Diagnosis not present

## 2019-11-22 DIAGNOSIS — E782 Mixed hyperlipidemia: Secondary | ICD-10-CM | POA: Diagnosis not present

## 2019-11-22 DIAGNOSIS — F3341 Major depressive disorder, recurrent, in partial remission: Secondary | ICD-10-CM

## 2019-11-22 DIAGNOSIS — J4520 Mild intermittent asthma, uncomplicated: Secondary | ICD-10-CM | POA: Diagnosis not present

## 2019-11-22 MED ORDER — ALBUTEROL SULFATE HFA 108 (90 BASE) MCG/ACT IN AERS: 2.0000 | INHALATION_SPRAY | Freq: Four times a day (QID) | RESPIRATORY_TRACT | 3 refills | Status: DC | PRN

## 2019-11-22 MED ORDER — ERGOCALCIFEROL 1.25 MG (50000 UT) PO CAPS: 50000.0000 [IU] | ORAL_CAPSULE | ORAL | 5 refills | Status: DC

## 2019-11-22 NOTE — Progress Notes (Signed)
[facility] [place], [place] [address]  Internal MEDICINE  Office Visit Note  Patient Name: Karen Castro  [id number]  [id number]  Date of Service: 11/22/2019  Chief Complaint  Patient presents with  . Hyperlipidemia  . Arthritis  . Quality Metric Gaps    mammogram    The patient is here for routine follow up. She recently had routine, fasting labs done. Lipid panel still slightly elevated, but much better since her last check. Continues to take her rosuvastatin. No concerns or negative side effects from this. Dos have moderate vitamin d deficiency. Did have COVID 19 in January. Since then, has had to use rescue inhaler more often. Needs to have refills for this today.  She had her mammogram 11/15/2019 and it was negative. She has no new concerns or complaints.       Current Medication: Outpatient Encounter Medications as of 11/22/2019  Medication Sig  . Adalimumab (HUMIRA PEN) 40 MG/0.4ML PNKT Inject 40 mg into the skin every 14 (fourteen) days.  Marland Kitchen albuterol (VENTOLIN HFA) 108 (90 Base) MCG/ACT inhaler Inhale 2 puffs into the lungs every 6 (six) hours as needed for wheezing or shortness of breath.  . Desvenlafaxine ER (PRISTIQ) 50 MG TB24 Take 3 tablets (150 mg total) by mouth daily.  . folic acid (FOLVITE) 1 MG tablet Take 1 mg by mouth daily.   Marland Kitchen loratadine (CLARITIN) 10 MG tablet Take 10 mg by mouth daily.  . methotrexate (RHEUMATREX) 2.5 MG tablet Take by mouth once a week.   . nystatin (MYCOSTATIN/NYSTOP) powder Apply topically 4 (four) times daily.  Marland Kitchen omeprazole (PRILOSEC) 20 MG capsule Take 1 capsule (20 mg total) by mouth daily.  . rosuvastatin (CRESTOR) 5 MG tablet Take 1 tablet (5 mg total) by mouth daily with supper.  . [DISCONTINUED] albuterol (VENTOLIN HFA) 108 (90 Base) MCG/ACT inhaler Inhale 2 puffs into the lungs every 6 (six) hours as needed for wheezing or shortness of breath.  . ergocalciferol (DRISDOL) 1.25 MG (50000 UT) capsule  Take 1 capsule (50,000 Units total) by mouth once a week.  . [DISCONTINUED] predniSONE (DELTASONE) 10 MG tablet Use per dose pack (Patient not taking: Reported on 11/22/2019)   No facility-administered encounter medications on file as of 11/22/2019.    Surgical History: Past Surgical History:  Procedure Laterality Date  . ABDOMINAL HYSTERECTOMY    . BREAST BIOPSY Right    core- neg    Medical History: Past Medical History:  Diagnosis Date  . Arthritis   . Hypercholesteremia     Family History: Family History  Problem Relation Age of Onset  . Breast cancer Maternal Grandmother 74    Social History   Socioeconomic History  . Marital status: Married    Spouse name: Not on file  . Number of children: Not on file  . Years of education: Not on file  . Highest education level: Not on file  Occupational History  . Not on file  Tobacco Use  . Smoking status: Current Every Day Smoker    Packs/day: 1.00    Types: Cigarettes  . Smokeless tobacco: Never Used  . Tobacco comment: HAS TRIED TO QUIT   Substance and Sexual Activity  . Alcohol use: Yes    Comment: socially  . Drug use: No  . Sexual activity: Not on file  Other Topics Concern  . Not on file  Social History Narrative  . Not on file   Social Determinants of Health   Financial  Resource Strain:   . Difficulty of Paying Living Expenses:   Food Insecurity:   . Worried About Programme researcher, broadcasting/film/video in the Last Year:   . Barista in the Last Year:   Transportation Needs:   . Freight forwarder (Medical):   Marland Kitchen Lack of Transportation (Non-Medical):   Physical Activity:   . Days of Exercise per Week:   . Minutes of Exercise per Session:   Stress:   . Feeling of Stress :   Social Connections:   . Frequency of Communication with Friends and Family:   . Frequency of Social Gatherings with Friends and Family:   . Attends Religious Services:   . Active Member of Clubs or Organizations:   . Attends Tax inspector Meetings:   Marland Kitchen Marital Status:   Intimate Partner Violence:   . Fear of Current or Ex-Partner:   . Emotionally Abused:   Marland Kitchen Physically Abused:   . Sexually Abused:       Review of Systems  Constitutional: Positive for fatigue. Negative for chills and unexpected weight change.  HENT: Negative for congestion, rhinorrhea, sneezing and sore throat.   Respiratory: Positive for cough. Negative for chest tightness and shortness of breath.   Cardiovascular: Negative for chest pain and palpitations.  Gastrointestinal: Negative for abdominal pain, constipation, diarrhea, nausea and vomiting.  Musculoskeletal: Positive for arthralgias and myalgias. Negative for back pain, joint swelling and neck pain.  Skin: Negative for rash.  Allergic/Immunologic: Negative for environmental allergies.  Neurological: Negative for dizziness, tremors, numbness and headaches.  Hematological: Negative for adenopathy. Does not bruise/bleed easily.  Psychiatric/Behavioral: Positive for dysphoric mood. Negative for behavioral problems and sleep disturbance. The patient is not nervous/anxious.     Today's Vitals   11/22/19 0829  BP: (!) 146/66  Pulse: 69  Resp: 16  Temp: (!) 97.1 F (36.2 C)  SpO2: 98%  Weight: 242 lb 3.2 oz (109.9 kg)  Height: 5\' 8"  (1.727 m)   Body mass index is 36.83 kg/m.  Physical Exam Vitals and nursing note reviewed.  Constitutional:      General: She is not in acute distress.    Appearance: Normal appearance. She is well-developed. She is not diaphoretic.  HENT:     Head: Normocephalic and atraumatic.     Nose: Nose normal.     Mouth/Throat:     Pharynx: No oropharyngeal exudate.  Eyes:     Pupils: Pupils are equal, round, and reactive to light.  Neck:     Thyroid: No thyromegaly.     Vascular: No JVD.     Trachea: No tracheal deviation.  Cardiovascular:     Rate and Rhythm: Normal rate and regular rhythm.     Heart sounds: Normal heart sounds. No murmur. No  friction rub. No gallop.   Pulmonary:     Effort: Pulmonary effort is normal. No respiratory distress.     Breath sounds: Normal breath sounds. No wheezing or rales.     Comments: Intermittent, congested, non-productive cough.  Chest:     Chest wall: No tenderness.  Abdominal:     Palpations: Abdomen is soft.  Musculoskeletal:        General: Normal range of motion.     Cervical back: Normal range of motion and neck supple.  Lymphadenopathy:     Cervical: No cervical adenopathy.  Skin:    General: Skin is warm and dry.  Neurological:     Mental Status: She is alert and  oriented to person, place, and time.     Cranial Nerves: No cranial nerve deficit.  Psychiatric:        Mood and Affect: Mood normal.        Behavior: Behavior normal.        Thought Content: Thought content normal.        Judgment: Judgment normal.   Assessment/Plan: 1. Mild intermittent asthma without complication Improving and stable. Continue to use rescue inhaler as needed and as prescribed  - albuterol (VENTOLIN HFA) 108 (90 Base) MCG/ACT inhaler; Inhale 2 puffs into the lungs every 6 (six) hours as needed for wheezing or shortness of breath.  Dispense: 18 g; Refill: 3  2. Mixed hyperlipidemia Improved fasting lipid profile. Continue rosuvastatin as prescribed   3. Vitamin D deficiency Start vitamin D weekly - ergocalciferol (DRISDOL) 1.25 MG (50000 UT) capsule; Take 1 capsule (50,000 Units total) by mouth once a week.  Dispense: 4 capsule; Refill: 5  4. Recurrent major depressive disorder, in partial remission (HCC) Stable. Continue pristiq as prescribed   General Counseling: charlese gruetzmacher understanding of the findings of todays visit and agrees with plan of treatment. I have discussed any further diagnostic evaluation that may be needed or ordered today. We also reviewed her medications today. she has been encouraged to call the office with any questions or concerns that should arise related to todays  visit.   This patient was seen by Vincent Gros FNP Collaboration with Dr Lyndon Code as a part of collaborative care agreement  Meds ordered this encounter  Medications  . ergocalciferol (DRISDOL) 1.25 MG (50000 UT) capsule    Sig: Take 1 capsule (50,000 Units total) by mouth once a week.    Dispense:  4 capsule    Refill:  5    Order Specific Question:   Supervising Provider    Answer:   Lyndon Code [[id number]]  . albuterol (VENTOLIN HFA) 108 (90 Base) MCG/ACT inhaler    Sig: Inhale 2 puffs into the lungs every 6 (six) hours as needed for wheezing or shortness of breath.    Dispense:  18 g    Refill:  3    Order Specific Question:   Supervising Provider    Answer:   Lyndon Code [[id number]]    Total time spent: 25 Minutes Time spent includes review of chart, medications, test results, and follow up plan with the patient.      Dr Lyndon Code Internal medicine

## 2019-12-19 MED FILL — HUMIRA PEN 40 MG/0.4ML PNKT: 40 | 28 days supply | Qty: 2 | Fill #3

## 2019-12-19 MED FILL — METHOTREXATE 2.5 MG TAB: 2.5 | 84 days supply | Qty: 84 | Fill #1

## 2020-01-11 ENCOUNTER — Telehealth: Payer: Self-pay

## 2020-01-11 ENCOUNTER — Other Ambulatory Visit: Payer: Self-pay | Admitting: Nurse Practitioner

## 2020-01-11 DIAGNOSIS — B001 Herpesviral vesicular dermatitis: Secondary | ICD-10-CM

## 2020-01-11 MED ORDER — VALACYCLOVIR HCL 1 G PO TABS: 1000.0000 mg | ORAL_TABLET | Freq: Two times a day (BID) | ORAL | 0 refills | Status: DC

## 2020-01-11 NOTE — Telephone Encounter (Signed)
For fever blister, sent prescription for valacyclovir 1000mg  twice daily for 7 days. Recommend she use OTC abreva topically as needed and as indicated.

## 2020-01-11 NOTE — Progress Notes (Signed)
For fever blister, sent prescription for valacyclovir 1000mg twice daily for 7 days. Recommend she use OTC abreva topically as needed and as indicated.

## 2020-01-11 NOTE — Telephone Encounter (Signed)
Pt advised we send  med to [facility]  

## 2020-01-19 MED FILL — HUMIRA PEN 40 MG/0.4ML PNKT: 40 | 28 days supply | Qty: 2 | Fill #4

## 2020-02-24 ENCOUNTER — Telehealth: Payer: Self-pay

## 2020-02-24 ENCOUNTER — Other Ambulatory Visit: Payer: Self-pay

## 2020-02-24 MED ORDER — AZITHROMYCIN 250 MG PO TABS: ORAL_TABLET | ORAL | 0 refills | Status: DC

## 2020-02-24 NOTE — Telephone Encounter (Signed)
Error

## 2020-02-24 NOTE — Telephone Encounter (Signed)
Pt called that she had sinus infection as per heather send zpak and advised pt to call us back and confirm her appt for physical on tuesday

## 2020-02-28 ENCOUNTER — Other Ambulatory Visit: Payer: Self-pay

## 2020-02-28 ENCOUNTER — Encounter: Payer: Self-pay | Admitting: Nurse Practitioner

## 2020-02-28 ENCOUNTER — Ambulatory Visit (INDEPENDENT_AMBULATORY_CARE_PROVIDER_SITE_OTHER): Payer: 59 | Admitting: Nurse Practitioner

## 2020-02-28 VITALS — BP 147/90 | HR 87 | Temp 97.4°F | Resp 16 | Ht 68.0 in | Wt 237.4 lb

## 2020-02-28 DIAGNOSIS — Z0001 Encounter for general adult medical examination with abnormal findings: Secondary | ICD-10-CM

## 2020-02-28 DIAGNOSIS — E782 Mixed hyperlipidemia: Secondary | ICD-10-CM

## 2020-02-28 DIAGNOSIS — F3341 Major depressive disorder, recurrent, in partial remission: Secondary | ICD-10-CM

## 2020-02-28 DIAGNOSIS — J069 Acute upper respiratory infection, unspecified: Secondary | ICD-10-CM | POA: Diagnosis not present

## 2020-02-28 DIAGNOSIS — R300 Dysuria: Secondary | ICD-10-CM

## 2020-02-28 DIAGNOSIS — J4520 Mild intermittent asthma, uncomplicated: Secondary | ICD-10-CM

## 2020-02-28 MED ORDER — PREDNISONE 10 MG (21) PO TBPK: ORAL_TABLET | ORAL | 0 refills | Status: DC

## 2020-02-28 MED ORDER — AZITHROMYCIN 250 MG PO TABS: ORAL_TABLET | ORAL | 0 refills | Status: DC

## 2020-02-28 NOTE — Progress Notes (Signed)
[facility] [address] [place], [place] [address]  Internal MEDICINE  Office Visit Note  Patient Name: Karen Castro  [id number]  [id number]  Date of Service: 03/05/2020   Pt is here for routine health maintenance examination  Chief Complaint  Patient presents with  . Annual Exam  . Hyperlipidemia     The patient is here for health maintenance exam. She does have some hoarseness with cough and congestion. Was started on z-pack last week. Takes last dose today. Generally has t o take two rounds of z-pack and round prednisone to get rid of infections. She admits to smoking, probably more than she should while she was on vacation.  She states that she is otherwise doing well.     Current Medication: Outpatient Encounter Medications as of 02/28/2020  Medication Sig  . Adalimumab (HUMIRA PEN) 40 MG/0.4ML PNKT Inject 40 mg into the skin every 14 (fourteen) days.  Marland Kitchen albuterol (VENTOLIN HFA) 108 (90 Base) MCG/ACT inhaler Inhale 2 puffs into the lungs every 6 (six) hours as needed for wheezing or shortness of breath.  Marland Kitchen azithromycin (ZITHROMAX Z-PAK) 250 MG tablet Use as directed for 5 days for sinus infection  . Desvenlafaxine ER (PRISTIQ) 50 MG TB24 Take 3 tablets (150 mg total) by mouth daily.  . ergocalciferol (DRISDOL) 1.25 MG (50000 UT) capsule Take 1 capsule (50,000 Units total) by mouth once a week.  . fexofenadine (ALLEGRA) 180 MG tablet Take 180 mg by mouth daily.  . folic acid (FOLVITE) 1 MG tablet Take 1 mg by mouth daily.   . methotrexate (RHEUMATREX) 2.5 MG tablet Take by mouth once a week.   . nystatin (MYCOSTATIN/NYSTOP) powder Apply topically 4 (four) times daily.  Marland Kitchen omeprazole (PRILOSEC) 20 MG capsule Take 1 capsule (20 mg total) by mouth daily.  . rosuvastatin (CRESTOR) 5 MG tablet Take 1 tablet (5 mg total) by mouth daily with supper.  . valACYclovir (VALTREX) 1000 MG tablet Take 1 tablet (1,000 mg total) by mouth 2 (two) times daily.  .  [DISCONTINUED] azithromycin (ZITHROMAX Z-PAK) 250 MG tablet Use as directed for 5 days for sinus infection  . predniSONE (STERAPRED UNI-PAK 21 TAB) 10 MG (21) TBPK tablet 6 day taper - take by mouth as directed for 6 days  . [DISCONTINUED] loratadine (CLARITIN) 10 MG tablet Take 10 mg by mouth daily. (Patient not taking: Reported on 02/28/2020)   No facility-administered encounter medications on file as of 02/28/2020.    Surgical History: Past Surgical History:  Procedure Laterality Date  . ABDOMINAL HYSTERECTOMY    . BREAST BIOPSY Right    core- neg    Medical History: Past Medical History:  Diagnosis Date  . Arthritis   . Hypercholesteremia     Family History: Family History  Problem Relation Age of Onset  . Breast cancer Maternal Grandmother 74      Review of Systems  Constitutional: Positive for fatigue. Negative for chills and unexpected weight change.  HENT: Positive for congestion, postnasal drip, rhinorrhea, sinus pressure and sinus pain. Negative for sneezing and sore throat.   Respiratory: Positive for cough. Negative for chest tightness, shortness of breath and wheezing.   Cardiovascular: Negative for chest pain and palpitations.  Gastrointestinal: Negative for abdominal pain, constipation, diarrhea, nausea and vomiting.  Endocrine: Negative for cold intolerance, heat intolerance, polydipsia and polyuria.  Genitourinary: Negative for dysuria and frequency.  Musculoskeletal: Positive for arthralgias, joint swelling and myalgias. Negative for back pain and neck pain.  The patient is seeing rheumatology.   Skin: Negative for rash.  Allergic/Immunologic: Negative for environmental allergies.  Neurological: Negative for dizziness, tremors, numbness and headaches.  Hematological: Negative for adenopathy. Does not bruise/bleed easily.  Psychiatric/Behavioral: Negative for behavioral problems (Depression), sleep disturbance and suicidal ideas. The patient is not  nervous/anxious.      Today's Vitals   02/28/20 0831  BP: (!) 147/90  Pulse: 87  Resp: 16  Temp: (!) 97.4 F (36.3 C)  SpO2: 94%  Weight: 237 lb 6.4 oz (107.7 kg)  Height: 5\' 8"  (1.727 m)   Body mass index is 36.1 kg/m.   Physical Exam Vitals and nursing note reviewed.  Constitutional:      General: She is not in acute distress.    Appearance: Normal appearance. She is well-developed. She is not diaphoretic.  HENT:     Head: Normocephalic and atraumatic.     Right Ear: External ear normal.     Left Ear: External ear normal.     Nose: Congestion present.     Right Sinus: Maxillary sinus tenderness and frontal sinus tenderness present.     Left Sinus: Maxillary sinus tenderness and frontal sinus tenderness present.     Mouth/Throat:     Pharynx: Posterior oropharyngeal erythema present. No oropharyngeal exudate.  Eyes:     Pupils: Pupils are equal, round, and reactive to light.  Neck:     Thyroid: No thyromegaly.     Vascular: No carotid bruit or JVD.     Trachea: No tracheal deviation.  Cardiovascular:     Rate and Rhythm: Normal rate and regular rhythm.     Pulses: Normal pulses.     Heart sounds: Normal heart sounds. No murmur heard.  No friction rub. No gallop.   Pulmonary:     Effort: Pulmonary effort is normal. No respiratory distress.     Breath sounds: Normal breath sounds. No wheezing or rales.  Chest:     Chest wall: No tenderness.     Breasts:        Right: Normal. No swelling, bleeding, inverted nipple, mass, nipple discharge, skin change or tenderness.        Left: Normal. No swelling, bleeding, inverted nipple, mass, nipple discharge, skin change or tenderness.  Abdominal:     General: Bowel sounds are normal.     Palpations: Abdomen is soft.     Tenderness: There is no abdominal tenderness.  Musculoskeletal:        General: Normal range of motion.     Cervical back: Normal range of motion and neck supple.  Lymphadenopathy:     Cervical: No  cervical adenopathy.     Upper Body:     Right upper body: No axillary adenopathy.     Left upper body: No axillary adenopathy.  Skin:    General: Skin is warm and dry.  Neurological:     General: No focal deficit present.     Mental Status: She is alert and oriented to person, place, and time.     Cranial Nerves: No cranial nerve deficit.  Psychiatric:        Mood and Affect: Mood normal.        Behavior: Behavior normal.        Thought Content: Thought content normal.        Judgment: Judgment normal.      LABS: Recent Results (from the past 2160 hour(s))  Urinalysis, Routine w reflex microscopic     Status: None  Collection Time: 02/28/20  8:34 AM  Result Value Ref Range   Specific Gravity, UA 1.024 1.005 - 1.030   pH, UA 5.0 5.0 - 7.5   Color, UA Yellow Yellow   Appearance Ur Clear Clear   Leukocytes,UA Negative Negative   Protein,UA Negative Negative/Trace   Glucose, UA Negative Negative   Ketones, UA Negative Negative   RBC, UA Negative Negative   Bilirubin, UA Negative Negative   Urobilinogen, Ur 0.2 0.2 - 1.0 mg/dL   Nitrite, UA Negative Negative   Microscopic Examination Comment     Comment: Microscopic not indicated and not performed.    Assessment/Plan: 1. Encounter for general adult medical examination with abnormal findings Annual health maintenance exam today.   2. Acute upper respiratory infection Improving but not resolved. Repeat dose z-pack. Take as directed for 5 days. Add prednisone dose pack. Take as directed fro 6 days. Continue to take OTC medications as needed and as indicated for acute symptoms.  - predniSONE (STERAPRED UNI-PAK 21 TAB) 10 MG (21) TBPK tablet; 6 day taper - take by mouth as directed for 6 days  Dispense: 21 tablet; Refill: 0 - azithromycin (ZITHROMAX Z-PAK) 250 MG tablet; Use as directed for 5 days for sinus infection  Dispense: 6 each; Refill: 0  3. Mild intermittent asthma without complication Use rescue inhaler as needed  and as prescribed   4. Mixed hyperlipidemia Improved. Continue with current dose crestor as prescribed   5. Recurrent major depressive disorder, in partial remission (HCC) Well managed. Continue with pristiq as prescribed   6. Dysuria - Urinalysis, Routine w reflex microscopic  General Counseling: Letitia Neri understanding of the findings of todays visit and agrees with plan of treatment. I have discussed any further diagnostic evaluation that may be needed or ordered today. We also reviewed her medications today. she has been encouraged to call the office with any questions or concerns that should arise related to todays visit.    Counseling:  This patient was seen by Vincent Gros FNP Collaboration with Dr Lyndon Code as a part of collaborative care agreement  Orders Placed This Encounter  Procedures  . Urinalysis, Routine w reflex microscopic    Meds ordered this encounter  Medications  . predniSONE (STERAPRED UNI-PAK 21 TAB) 10 MG (21) TBPK tablet    Sig: 6 day taper - take by mouth as directed for 6 days    Dispense:  21 tablet    Refill:  0    Order Specific Question:   Supervising Provider    Answer:   Lyndon Code [[id number]]  . azithromycin (ZITHROMAX Z-PAK) 250 MG tablet    Sig: Use as directed for 5 days for sinus infection    Dispense:  6 each    Refill:  0    Repeat z-pack for continued URI    Order Specific Question:   Supervising Provider    Answer:   Lyndon Code [[id number]]    Total time spent: 45 Minutes  Time spent includes review of chart, medications, test results, and follow up plan with the patient.     Lyndon Code, MD  Internal Medicine

## 2020-02-29 LAB — URINALYSIS, ROUTINE W REFLEX MICROSCOPIC
Bilirubin, UA: NEGATIVE
Glucose, UA: NEGATIVE
Ketones, UA: NEGATIVE
Leukocytes,UA: NEGATIVE
Nitrite, UA: NEGATIVE
Protein,UA: NEGATIVE
RBC, UA: NEGATIVE
Specific Gravity, UA: 1.024 (ref 1.005–1.030)
Urobilinogen, Ur: 0.2 mg/dL (ref 0.2–1.0)
pH, UA: 5 (ref 5.0–7.5)

## 2020-03-01 MED FILL — HUMIRA PEN 40 MG/0.4ML PNKT: 40 | 28 days supply | Qty: 2 | Fill #5

## 2020-03-05 DIAGNOSIS — Z0001 Encounter for general adult medical examination with abnormal findings: Secondary | ICD-10-CM | POA: Insufficient documentation

## 2020-03-12 ENCOUNTER — Ambulatory Visit (HOSPITAL_BASED_OUTPATIENT_CLINIC_OR_DEPARTMENT_OTHER): Payer: 59 | Admitting: Pharmacist

## 2020-03-12 ENCOUNTER — Other Ambulatory Visit: Payer: Self-pay

## 2020-03-12 DIAGNOSIS — Z79899 Other long term (current) drug therapy: Secondary | ICD-10-CM

## 2020-03-12 NOTE — Progress Notes (Signed)
  S: Patient presents for review of their specialty medication therapy.  Patient is currently taking Humira for RA. Patient is managed by Dr. Gavin Potters for this.   Adherence: confirms  Efficacy: reports doing well  Dosing:  Rheumatoid arthritis: SubQ: 40 mg every other week (may continue methotrexate, other nonbiologic DMARDS, corticosteroids, NSAIDs, and/or analgesics); patients not taking concomitant methotrexate may increase dose to 40 mg every week  Dose adjustments: Renal: no dose adjustments (has not been studied) Hepatic: no dose adjustments (has not been studied)  Drug-drug interactions: no DDIs identified in regards to Humira. Of note, concurrent use of MTX and omeprazole may increase MTX concentrations and therefore ris of toxicity.   Screening: TB test: completed per patient Hepatitis: completed per patient   Monitoring: S/sx of infection: none CBC: regularly monitored per patient S/sx of hypersensitivity: none S/sx of malignancy: none S/sx of heart failure: none  Other side effects: none  O:  Lab Results  Component Value Date   WBC 9.1 06/15/2015   HGB 13.2 06/15/2015   HCT 39.7 06/15/2015   MCV 89.5 06/15/2015   PLT 264 06/15/2015      Chemistry      Component Value Date/Time   NA 139 01/21/2018 1131   K 3.7 01/21/2018 1131   CL 106 01/21/2018 1131   CO2 26 01/21/2018 1131   BUN 18 01/21/2018 1131   CREATININE 0.55 01/21/2018 1131      Component Value Date/Time   CALCIUM 9.2 01/21/2018 1131   ALKPHOS 104 01/21/2018 1131   AST 20 01/21/2018 1131   ALT 20 01/21/2018 1131   BILITOT 0.5 01/21/2018 1131      A/P: 1. Medication review: Patient currently on Humira for RA. Reviewed the medication with the patient, including the following: Humira is a TNF blocking agent indicated for ankylosing spondylitis, Crohn's disease, Hidradenitis suppurativa, psoriatic arthritis, plaque psoriasis, ulcerative colitis, and uveitis. Patient educated on purpose,  proper use and potential adverse effects of Humira. Possible adverse effects are increased risk of infections, headache, and injection site reactions. There is the possibility of an increased risk of malignancy but it is not well understood if this increased risk is due to there medication or the disease state. There are rare cases of pancytopenia and aplastic anemia. For SubQ injection at separate sites in the thigh or lower abdomen (avoiding areas within 2 inches of navel); rotate injection sites. May leave at room temperature for ~15 to 30 minutes prior to use; do not remove cap or cover while allowing product to reach room temperature. Do not use if solution is discolored or contains particulate matter. Do not administer to skin which is red, tender, bruised, hard, or that has scars, stretch marks, or psoriasis plaques. Needle cap of the prefilled syringe or needle cover for the adalimumab pen may contain latex. Prefilled pens and syringes are available for use by patients and the full amount of the syringe should be injected (self-administration); the vial is intended for institutional use only. Vials do not contain a preservative; discard unused portion. No recommendations for any changes at this time.   Butch Penny, PharmD, CPP Clinical Pharmacist [facility] [phone]

## 2020-03-13 DIAGNOSIS — Z79899 Other long term (current) drug therapy: Secondary | ICD-10-CM | POA: Diagnosis not present

## 2020-03-13 DIAGNOSIS — M0579 Rheumatoid arthritis with rheumatoid factor of multiple sites without organ or systems involvement: Secondary | ICD-10-CM | POA: Diagnosis not present

## 2020-03-19 ENCOUNTER — Other Ambulatory Visit: Payer: Self-pay

## 2020-03-19 ENCOUNTER — Emergency Department
Admission: EM | Admit: 2020-03-19 | Discharge: 2020-03-19 | Disposition: A | Discharge status: Home / Self Care | Payer: 59 | Attending: Emergency Medicine | Admitting: Emergency Medicine

## 2020-03-19 DIAGNOSIS — Y929 Unspecified place or not applicable: Secondary | ICD-10-CM | POA: Insufficient documentation

## 2020-03-19 DIAGNOSIS — G250 Essential tremor: Secondary | ICD-10-CM | POA: Diagnosis not present

## 2020-03-19 DIAGNOSIS — D72829 Elevated white blood cell count, unspecified: Secondary | ICD-10-CM | POA: Diagnosis not present

## 2020-03-19 DIAGNOSIS — X58XXXA Exposure to other specified factors, initial encounter: Secondary | ICD-10-CM | POA: Diagnosis not present

## 2020-03-19 DIAGNOSIS — F17210 Nicotine dependence, cigarettes, uncomplicated: Secondary | ICD-10-CM | POA: Insufficient documentation

## 2020-03-19 DIAGNOSIS — I83891 Varicose veins of right lower extremities with other complications: Secondary | ICD-10-CM | POA: Insufficient documentation

## 2020-03-19 DIAGNOSIS — Y939 Activity, unspecified: Secondary | ICD-10-CM | POA: Insufficient documentation

## 2020-03-19 DIAGNOSIS — J4520 Mild intermittent asthma, uncomplicated: Secondary | ICD-10-CM | POA: Diagnosis not present

## 2020-03-19 DIAGNOSIS — Y999 Unspecified external cause status: Secondary | ICD-10-CM | POA: Diagnosis not present

## 2020-03-19 DIAGNOSIS — S81811A Laceration without foreign body, right lower leg, initial encounter: Secondary | ICD-10-CM | POA: Diagnosis present

## 2020-03-19 DIAGNOSIS — Z79899 Other long term (current) drug therapy: Secondary | ICD-10-CM | POA: Diagnosis not present

## 2020-03-19 DIAGNOSIS — I83899 Varicose veins of unspecified lower extremities with other complications: Secondary | ICD-10-CM

## 2020-03-19 DIAGNOSIS — M0579 Rheumatoid arthritis with rheumatoid factor of multiple sites without organ or systems involvement: Secondary | ICD-10-CM | POA: Diagnosis not present

## 2020-03-19 NOTE — ED Notes (Signed)
See triage note  States she was shaving her legs  Hit a varicose vein

## 2020-03-19 NOTE — ED Triage Notes (Signed)
Pt comes via POV from home with c/o laceration to right leg. Pt states she was shaving and cut a varicose vein. Pt has bandage in place. Pt denies any blood thinners.

## 2020-03-19 NOTE — Discharge Instructions (Signed)
Continue to apply pressure to this area and leave the dressing that was put on your leg on for 2 days.  Then you may remove this dressing and cover as needed.

## 2020-03-19 NOTE — ED Provider Notes (Signed)
[facility] Emergency Department Provider Note   ____________________________________________   First MD Initiated Contact with Patient 03/19/20 [phone]     (approximate)  I have reviewed the triage vital signs and the nursing notes.   HISTORY  Chief Complaint Laceration    HPI Karen Petz Province is a 59 y.o. female presents to the ED with complaint of bleeding on her right leg.  Patient states that she was shaving her legs and hit a varicose vein..  Patient has a pressure dressing on it at this time.  She is not on any blood thinners.  She rates her pain as 0/10.      Past Medical History:  Diagnosis Date   Arthritis    Hypercholesteremia     Patient Active Problem List   Diagnosis Date Noted   Encounter for general adult medical examination with abnormal findings 03/05/2020   Vitamin D deficiency 11/22/2019   Mild intermittent asthma without complication 07/25/2019   Cellulitis and abscess of foot 03/02/2019   Atopic dermatitis 03/02/2019   Nausea and vomiting 01/21/2019   Excessive daytime sleepiness 07/20/2018   Moderate recurrent major depression (HCC) 07/20/2018   Gastro-esophageal reflux disease without esophagitis 07/20/2018   Screening for breast cancer 01/14/2018   Acute upper respiratory infection 01/14/2018   Mixed hyperlipidemia 01/14/2018   Recurrent major depressive disorder, in partial remission (HCC) 01/14/2018   Rheumatoid arthritis (HCC) 01/14/2018   Dysuria 01/14/2018    Past Surgical History:  Procedure Laterality Date   ABDOMINAL HYSTERECTOMY     BREAST BIOPSY Right    core- neg    Prior to Admission medications   Medication Sig Start Date End Date Taking? Authorizing Provider  Adalimumab (HUMIRA PEN) 40 MG/0.4ML PNKT Inject 40 mg into the skin every 14 (fourteen) days. 08/22/19   Quentin Angst, MD  albuterol (VENTOLIN HFA) 108 (90 Base) MCG/ACT inhaler Inhale 2 puffs into the lungs every 6  (six) hours as needed for wheezing or shortness of breath. 11/22/19   Carlean Jews, NP  Desvenlafaxine ER (PRISTIQ) 50 MG TB24 Take 3 tablets (150 mg total) by mouth daily. 10/19/19   Carlean Jews, NP  ergocalciferol (DRISDOL) 1.25 MG (50000 UT) capsule Take 1 capsule (50,000 Units total) by mouth once a week. 11/22/19   Carlean Jews, NP  fexofenadine (ALLEGRA) 180 MG tablet Take 180 mg by mouth daily.    [provider]  folic acid (FOLVITE) 1 MG tablet Take 1 mg by mouth daily.  01/19/17   [provider]  methotrexate (RHEUMATREX) 2.5 MG tablet Take by mouth once a week.  12/07/17   [provider]  nystatin (MYCOSTATIN/NYSTOP) powder Apply topically 4 (four) times daily. 08/17/19   Carlean Jews, NP  omeprazole (PRILOSEC) 20 MG capsule Take 1 capsule (20 mg total) by mouth daily. 10/19/19   Carlean Jews, NP  rosuvastatin (CRESTOR) 5 MG tablet Take 1 tablet (5 mg total) by mouth daily with supper. 10/19/19   Carlean Jews, NP  valACYclovir (VALTREX) 1000 MG tablet Take 1 tablet (1,000 mg total) by mouth 2 (two) times daily. 01/11/20   Carlean Jews, NP    Allergies Patient has no known allergies.  Family History  Problem Relation Age of Onset   Breast cancer Maternal Grandmother 47    Social History Social History   Tobacco Use   Smoking status: Current Every Day Smoker    Packs/day: 1.00    Types: Cigarettes  Smokeless tobacco: Never Used   Tobacco comment: HAS TRIED TO QUIT   Vaping Use   Vaping Use: Never used  Substance Use Topics   Alcohol use: Yes    Comment: socially   Drug use: No    Review of Systems Constitutional: No fever/chills Cardiovascular: Denies chest pain. Respiratory: Denies shortness of breath. Musculoskeletal: Negative for muscle aches. Skin: Positive for open wound secondary to varicose vein. Neurological: Negative for  focal weakness or  numbness. ____________________________________________   PHYSICAL EXAM:  VITAL SIGNS: ED Triage Vitals  Enc Vitals Group     BP 03/19/20 0714 (!) 157/78     Pulse Rate 03/19/20 0714 78     Resp 03/19/20 0714 17     Temp 03/19/20 0714 98.9 F (37.2 C)     Temp src --      SpO2 03/19/20 0714 94 %     Weight 03/19/20 0712 240 lb (108.9 kg)     Height 03/19/20 0712 5\' 8"  (1.727 m)     Head Circumference --      Peak Flow --      Pain Score 03/19/20 0712 0     Pain Loc --      Pain Edu? --      Excl. in GC? --     Constitutional: Alert and oriented. Well appearing and in no acute distress. Eyes: Conjunctivae are normal.  Head: Atraumatic. Neck: No stridor.   Cardiovascular: Normal rate, regular rhythm. Grossly normal heart sounds.  Good peripheral circulation. Respiratory: Normal respiratory effort.  No retractions. Lungs CTAB. Musculoskeletal: Moves upper and lower extremities with any difficulty.  Normal gait was noted. Neurologic:  Normal speech and language. No gross focal neurologic deficits are appreciated.  Skin:  Skin is warm, dry.  Right leg lower anterior aspect there is a small varicose vein superficially.  No active bleeding at this time.  Area was watched for 10 minutes with no continued bleeding. Psychiatric: Mood and affect are normal. Speech and behavior are normal.  ____________________________________________   LABS (all labs ordered are listed, but only abnormal results are displayed)  Labs Reviewed - No data to display  PROCEDURES  Procedure(s) performed (including Critical Care):  Procedures   ____________________________________________   INITIAL IMPRESSION / ASSESSMENT AND PLAN / ED COURSE  As part of my medical decision making, I reviewed the following data within the electronic MEDICAL RECORD NUMBER Notes from prior ED visits and [place] Controlled Substance Database  59 year old female presents to the ED with complaint of bleeding after shaving her  leg.  Patient has varicose veins and has had similar episodes before.  Bleeding was controlled.  A hemostat surgical dressing with compression was applied to her leg.  Patient has a doctor's appointment with rheumatology at 8:15 AM and was planning to ask for a referral to someone vascular for treatment of her varicose veins.  ____________________________________________   FINAL CLINICAL IMPRESSION(S) / ED DIAGNOSES  Final diagnoses:  Bleeding from varicose vein     ED Discharge Orders    [place]       Note:  This document was prepared using Dragon voice recognition software and may include unintentional dictation errors.    77, PA-C 03/19/20 03/21/20    [id number], MD 03/19/20 1045

## 2020-03-27 ENCOUNTER — Other Ambulatory Visit: Payer: Self-pay | Admitting: Pharmacist

## 2020-03-27 MED ORDER — HUMIRA (2 PEN) 40 MG/0.4ML ~~LOC~~ AJKT: 40.0000 mg | AUTO-INJECTOR | SUBCUTANEOUS | 1 refills | Status: DC

## 2020-04-09 MED FILL — HUMIRA PEN 40 MG/0.4ML PNKT: 40 | 28 days supply | Qty: 2 | Fill #0

## 2020-04-19 ENCOUNTER — Other Ambulatory Visit: Payer: Self-pay | Admitting: Nurse Practitioner

## 2020-04-19 ENCOUNTER — Other Ambulatory Visit: Payer: Self-pay

## 2020-04-19 DIAGNOSIS — F331 Major depressive disorder, recurrent, moderate: Secondary | ICD-10-CM

## 2020-04-19 MED ORDER — DESVENLAFAXINE ER 50 MG PO TB24: 150.0000 mg | ORAL_TABLET | Freq: Every day | ORAL | 1 refills | Status: DC

## 2020-05-03 MED FILL — HUMIRA PEN 40 MG/0.4ML PNKT: 40 | 28 days supply | Qty: 2 | Fill #1

## 2020-06-11 MED FILL — HUMIRA PEN 40 MG/0.4ML PNKT: 40 | 28 days supply | Qty: 2 | Fill #2

## 2020-06-19 DIAGNOSIS — M0579 Rheumatoid arthritis with rheumatoid factor of multiple sites without organ or systems involvement: Secondary | ICD-10-CM | POA: Diagnosis not present

## 2020-06-19 DIAGNOSIS — Z79899 Other long term (current) drug therapy: Secondary | ICD-10-CM | POA: Diagnosis not present

## 2020-07-16 MED FILL — HUMIRA PEN 40 MG/0.4ML PNKT: 40 | 28 days supply | Qty: 2 | Fill #3

## 2020-08-09 ENCOUNTER — Other Ambulatory Visit: Payer: Self-pay

## 2020-08-09 ENCOUNTER — Other Ambulatory Visit: Payer: Self-pay | Admitting: Internal Medicine

## 2020-08-09 DIAGNOSIS — E782 Mixed hyperlipidemia: Secondary | ICD-10-CM

## 2020-08-09 MED ORDER — ROSUVASTATIN CALCIUM 5 MG PO TABS: 5.0000 mg | ORAL_TABLET | Freq: Every day | ORAL | 1 refills | Status: DC

## 2020-08-16 MED FILL — HUMIRA PEN 40 MG/0.4ML PNKT: 40 | 28 days supply | Qty: 2 | Fill #4

## 2020-08-30 ENCOUNTER — Ambulatory Visit: Payer: 59 | Admitting: Nurse Practitioner

## 2020-08-30 ENCOUNTER — Encounter: Payer: Self-pay | Admitting: Nurse Practitioner

## 2020-08-30 ENCOUNTER — Telehealth: Payer: Self-pay

## 2020-08-30 VITALS — BP 146/70 | HR 88 | Temp 97.6°F | Resp 16 | Ht 68.0 in | Wt 250.2 lb

## 2020-08-30 DIAGNOSIS — M069 Rheumatoid arthritis, unspecified: Secondary | ICD-10-CM | POA: Diagnosis not present

## 2020-08-30 DIAGNOSIS — E782 Mixed hyperlipidemia: Secondary | ICD-10-CM

## 2020-08-30 DIAGNOSIS — F3341 Major depressive disorder, recurrent, in partial remission: Secondary | ICD-10-CM

## 2020-08-30 NOTE — Progress Notes (Signed)
[facility] [address] [place], [place] [address]  Internal MEDICINE  Office Visit Note  Patient Name: Karen Castro  [id number]  [id number]  Date of Service: 09/20/2020  Chief Complaint  Patient presents with  . Follow-up  . Hyperlipidemia    The patient is here for follow up visit. Blood pressure elevated.Came to the office directly after working a 12 hour night shift.   Over all, she is feeling well. Has been gaining a little weight. Increased joint pain. She does see rheumatologist. Currently on Humira.  Denies chest pain, chest pressure, headahce, or shortness of breath.       Current Medication: Outpatient Encounter Medications as of 08/30/2020  Medication Sig  . Adalimumab (HUMIRA PEN) 40 MG/0.4ML PNKT Inject 40 mg into the skin every 14 (fourteen) days.  Marland Kitchen albuterol (VENTOLIN HFA) 108 (90 Base) MCG/ACT inhaler Inhale 2 puffs into the lungs every 6 (six) hours as needed for wheezing or shortness of breath.  . Desvenlafaxine ER (PRISTIQ) 50 MG TB24 Take 3 tablets (150 mg total) by mouth daily.  . ergocalciferol (DRISDOL) 1.25 MG (50000 UT) capsule Take 1 capsule (50,000 Units total) by mouth once a week.  . fexofenadine (ALLEGRA) 180 MG tablet Take 180 mg by mouth daily.  . folic acid (FOLVITE) 1 MG tablet Take 1 mg by mouth daily.   . methotrexate (RHEUMATREX) 2.5 MG tablet Take by mouth once a week.   . nystatin (MYCOSTATIN/NYSTOP) powder Apply topically 4 (four) times daily.  Marland Kitchen omeprazole (PRILOSEC) 20 MG capsule Take 1 capsule (20 mg total) by mouth daily.  . rosuvastatin (CRESTOR) 5 MG tablet Take 1 tablet (5 mg total) by mouth daily with supper.  . valACYclovir (VALTREX) 1000 MG tablet Take 1 tablet (1,000 mg total) by mouth 2 (two) times daily.   No facility-administered encounter medications on file as of 08/30/2020.    Surgical History: Past Surgical History:  Procedure Laterality Date  . ABDOMINAL HYSTERECTOMY    . BREAST BIOPSY Right     core- neg    Medical History: Past Medical History:  Diagnosis Date  . Arthritis   . Hypercholesteremia     Family History: Family History  Problem Relation Age of Onset  . Breast cancer Maternal Grandmother 54    Social History   Socioeconomic History  . Marital status: Married    Spouse name: Not on file  . Number of children: Not on file  . Years of education: Not on file  . Highest education level: Not on file  Occupational History  . Not on file  Tobacco Use  . Smoking status: Current Every Day Smoker    Packs/day: 1.00    Types: Cigarettes  . Smokeless tobacco: Never Used  . Tobacco comment: HAS TRIED TO QUIT   Vaping Use  . Vaping Use: Never used  Substance and Sexual Activity  . Alcohol use: Yes    Comment: socially  . Drug use: No  . Sexual activity: Not on file  Other Topics Concern  . Not on file  Social History Narrative  . Not on file   Social Determinants of Health   Financial Resource Strain: Not on file  Food Insecurity: Not on file  Transportation Needs: Not on file  Physical Activity: Not on file  Stress: Not on file  Social Connections: Not on file  Intimate Partner Violence: Not on file      Review of Systems  Constitutional: Positive for fatigue. Negative for chills  and unexpected weight change.       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   HENT: Negative for congestion, postnasal drip, sinus pressure, sinus pain, sneezing and sore throat.   Respiratory: Positive for cough. Negative for chest tightness, shortness of breath and wheezing.   Cardiovascular: Negative for chest pain and palpitations.  Gastrointestinal: Negative for abdominal pain, constipation, diarrhea, nausea and vomiting.  Endocrine: Negative for cold intolerance, heat intolerance, polydipsia and polyuria.  Musculoskeletal: Positive for arthralgias, joint swelling and myalgias. Negative for back pain and neck pain.       The patient is  seeing rheumatology.   Skin: Negative for rash.  Allergic/Immunologic: Negative for environmental allergies.  Neurological: Negative for dizziness, tremors, numbness and headaches.  Hematological: Negative for adenopathy. Does not bruise/bleed easily.  Psychiatric/Behavioral: Negative for behavioral problems (Depression), sleep disturbance and suicidal ideas. The patient is nervous/anxious.        Well managed with current medication.    Today's Vitals   08/30/20 0829  BP: (!) 146/70  Pulse: 88  Resp: 16  Temp: 97.6 F (36.4 C)  SpO2: 99%  Weight: 250 lb 3.2 oz (113.5 kg)  Height:  (1.727 m)   Body mass index is 38.04 kg/m.  Physical Exam Vitals and nursing note reviewed.  Constitutional:      General: She is not in acute distress.    Appearance: Normal appearance. She is well-developed. She is not diaphoretic.  HENT:     Head: Normocephalic and atraumatic.     Nose: Nose normal.     Mouth/Throat:     Pharynx: No oropharyngeal exudate.  Eyes:     Pupils: Pupils are equal, round, and reactive to light.  Neck:     Thyroid: No thyromegaly.     Vascular: No JVD.     Trachea: No tracheal deviation.  Cardiovascular:     Rate and Rhythm: Normal rate and regular rhythm.     Heart sounds: Normal heart sounds. No murmur heard. No friction rub. No gallop.   Pulmonary:     Effort: Pulmonary effort is normal. No respiratory distress.     Breath sounds: Normal breath sounds. No wheezing or rales.     Comments: Intermittent, congested, non-productive cough.  Chest:     Chest wall: No tenderness.  Abdominal:     Palpations: Abdomen is soft.  Musculoskeletal:        General: Normal range of motion.     Cervical back: Normal range of motion and neck supple.  Lymphadenopathy:     Cervical: No cervical adenopathy.  Skin:    General: Skin is warm and dry.  Neurological:     Mental Status: She is alert and oriented to person, place, and time.     Cranial Nerves: No cranial  nerve deficit.  Psychiatric:        Mood and Affect: Mood normal.        Behavior: Behavior normal.        Thought Content: Thought content normal.        Judgment: Judgment normal.    Assessment/Plan: 1. Mixed hyperlipidemia Stable. Continue rosuvastatin as prescribed   2. Recurrent major depressive disorder, in partial remission (HCC) Well managed. Continue pristiq  daily. Will refill as indicated  3. Rheumatoid arthritis, involving unspecified site, unspecified whether rheumatoid factor present (HCC) conitnue regular visits with rheumatology  General Counseling: melanie openshaw understanding of the findings of todays visit and  agrees with plan of treatment. I have discussed any further diagnostic evaluation that may be needed or ordered today. We also reviewed her medications today. she has been encouraged to call the office with any questions or concerns that should arise related to todays visit.  This patient was seen by Vincent Gros FNP Collaboration with Dr Lyndon Code as a part of collaborative care agreement   Total time spent: 25 Minutes   Time spent includes review of chart, medications, test results, and follow up plan with the patient.      Dr Lyndon Code Internal medicine

## 2020-08-30 NOTE — Telephone Encounter (Signed)
PATIENT WILL CALL BACK TO SCHEDULE 6 MONTH F/U/TAT

## 2020-09-04 ENCOUNTER — Other Ambulatory Visit: Payer: Self-pay | Admitting: Rheumatology

## 2020-09-14 DIAGNOSIS — M0579 Rheumatoid arthritis with rheumatoid factor of multiple sites without organ or systems involvement: Secondary | ICD-10-CM | POA: Diagnosis not present

## 2020-09-14 DIAGNOSIS — Z79899 Other long term (current) drug therapy: Secondary | ICD-10-CM | POA: Diagnosis not present

## 2020-09-20 ENCOUNTER — Encounter: Payer: Self-pay | Admitting: Nurse Practitioner

## 2020-09-20 DIAGNOSIS — G250 Essential tremor: Secondary | ICD-10-CM | POA: Diagnosis not present

## 2020-09-20 DIAGNOSIS — D72829 Elevated white blood cell count, unspecified: Secondary | ICD-10-CM | POA: Diagnosis not present

## 2020-09-20 DIAGNOSIS — M0579 Rheumatoid arthritis with rheumatoid factor of multiple sites without organ or systems involvement: Secondary | ICD-10-CM | POA: Diagnosis not present

## 2020-09-20 DIAGNOSIS — Z79899 Other long term (current) drug therapy: Secondary | ICD-10-CM | POA: Diagnosis not present

## 2020-09-20 MED FILL — HUMIRA PEN 40 MG/0.4ML PNKT: 40 | 28 days supply | Qty: 2 | Fill #5

## 2020-10-08 ENCOUNTER — Other Ambulatory Visit: Payer: Self-pay

## 2020-10-08 ENCOUNTER — Other Ambulatory Visit: Payer: Self-pay | Admitting: Hospice and Palliative Medicine

## 2020-10-08 DIAGNOSIS — F331 Major depressive disorder, recurrent, moderate: Secondary | ICD-10-CM

## 2020-10-08 DIAGNOSIS — K219 Gastro-esophageal reflux disease without esophagitis: Secondary | ICD-10-CM

## 2020-10-08 MED ORDER — DESVENLAFAXINE ER 50 MG PO TB24: 150.0000 mg | ORAL_TABLET | Freq: Every day | ORAL | 1 refills | Status: DC

## 2020-10-08 MED ORDER — OMEPRAZOLE 20 MG PO CPDR: 20.0000 mg | DELAYED_RELEASE_CAPSULE | Freq: Every day | ORAL | 0 refills | Status: DC

## 2020-10-17 ENCOUNTER — Telehealth: Payer: Self-pay

## 2020-10-17 NOTE — Telephone Encounter (Signed)
Called pt's pharmacy [facility] and asked about pristiq  Medication and pharmacist advised that pt picked up 270 tablets and it was 15.00.  PA must have been resolved per pharmacist.

## 2020-10-18 ENCOUNTER — Other Ambulatory Visit: Payer: Self-pay

## 2020-10-18 ENCOUNTER — Other Ambulatory Visit: Payer: Self-pay | Admitting: Hospice and Palliative Medicine

## 2020-10-18 MED ORDER — DESVENLAFAXINE SUCCINATE ER 50 MG PO TB24: ORAL_TABLET | ORAL | 3 refills | Status: DC

## 2020-11-05 ENCOUNTER — Ambulatory Visit
Admission: EM | Admit: 2020-11-05 | Discharge: 2020-11-05 | Disposition: A | Discharge status: Home / Self Care | Payer: 59 | Source: Home / Self Care

## 2020-11-05 ENCOUNTER — Other Ambulatory Visit: Payer: Self-pay

## 2020-11-05 ENCOUNTER — Other Ambulatory Visit: Payer: Self-pay | Admitting: Emergency Medicine

## 2020-11-05 DIAGNOSIS — N10 Acute tubulo-interstitial nephritis: Secondary | ICD-10-CM | POA: Insufficient documentation

## 2020-11-05 LAB — URINALYSIS, COMPLETE (UACMP) WITH MICROSCOPIC
Glucose, UA: NEGATIVE mg/dL
Nitrite: NEGATIVE
Protein, ur: 100 mg/dL — AB
RBC / HPF: >50 RBC/hpf (ref 0–5)
Specific Gravity, Urine: >1.03 — ABNORMAL HIGH (ref 1.005–1.030)
WBC, UA: >50 WBC/hpf (ref 0–5)
pH: 5.5 (ref 5.0–8.0)

## 2020-11-05 MED ORDER — CIPROFLOXACIN HCL 500 MG PO TABS: 500.0000 mg | ORAL_TABLET | Freq: Two times a day (BID) | ORAL | 0 refills | Status: DC

## 2020-11-05 NOTE — ED Provider Notes (Signed)
[facility]    CSN: [id number] Arrival date & time: 11/05/20  5809      History   Chief Complaint Chief Complaint  Patient presents with  . Urinary Urgency    HPI Karen Castro is a 60 y.o. female.   HPI   60 year old female here for evaluation of urinary complaints.  Patient reports that she has been experiencing urinary urgency and frequency for the past 2 weeks.  Patient reports that she has not really had a lot in the way of burning with urination.  Last night she developed rigors, chills, sweats, nausea, and high back pain on the right.  She feels very fatigued today.  Patient has had no vomiting.  Patient states that she was running a fever last night but she does not have a documented fever in clinic today.  Past Medical History:  Diagnosis Date  . Arthritis   . Hypercholesteremia     Patient Active Problem List   Diagnosis Date Noted  . Encounter for general adult medical examination with abnormal findings 03/05/2020  . Vitamin D deficiency 11/22/2019  . Mild intermittent asthma without complication 07/25/2019  . Cellulitis and abscess of foot 03/02/2019  . Atopic dermatitis 03/02/2019  . Nausea and vomiting 01/21/2019  . Excessive daytime sleepiness 07/20/2018  . Moderate recurrent major depression (HCC) 07/20/2018  . Gastro-esophageal reflux disease without esophagitis 07/20/2018  . Screening for breast cancer 01/14/2018  . Acute upper respiratory infection 01/14/2018  . Mixed hyperlipidemia 01/14/2018  . Recurrent major depressive disorder, in partial remission (HCC) 01/14/2018  . Rheumatoid arthritis (HCC) 01/14/2018  . Dysuria 01/14/2018    Past Surgical History:  Procedure Laterality Date  . ABDOMINAL HYSTERECTOMY    . BREAST BIOPSY Right    core- neg    OB History   No obstetric history on file.      Home Medications    Prior to Admission medications   Medication Sig Start Date End Date Taking? Authorizing Provider   Adalimumab (HUMIRA PEN) 40 MG/0.4ML PNKT Inject 40 mg into the skin every 14 (fourteen) days. 03/27/20  Yes Quentin Angst, MD  albuterol (VENTOLIN HFA) 108 (90 Base) MCG/ACT inhaler Inhale 2 puffs into the lungs every 6 (six) hours as needed for wheezing or shortness of breath. 11/22/19  Yes Boscia, Heather E, NP  ciprofloxacin (CIPRO) 500 MG tablet Take 1 tablet (500 mg total) by mouth 2 (two) times daily for 7 days. 11/05/20 11/12/20 Yes Becky Augusta, NP  desvenlafaxine (PRISTIQ) 50 MG 24 hr tablet Take 3 tablets by mouth daily 10/18/20  Yes Theotis Burrow, NP  Desvenlafaxine ER (PRISTIQ) 50 MG TB24 Take 3 tablets (150 mg total) by mouth daily. 10/08/20  Yes Theotis Burrow, NP  fexofenadine (ALLEGRA) 180 MG tablet Take 180 mg by mouth daily.   Yes [provider]  folic acid (FOLVITE) 1 MG tablet Take 1 mg by mouth daily.  01/19/17  Yes [provider]  methotrexate (RHEUMATREX) 2.5 MG tablet Take by mouth once a week.  12/07/17  Yes [provider]  nystatin (MYCOSTATIN/NYSTOP) powder Apply topically 4 (four) times daily. 08/17/19  Yes Boscia, Kathlynn Grate, NP  omeprazole (PRILOSEC) 20 MG capsule Take 1 capsule (20 mg total) by mouth daily. 10/08/20  Yes Theotis Burrow, NP  rosuvastatin (CRESTOR) 5 MG tablet Take 1 tablet (5 mg total) by mouth daily with supper. 08/09/20  Yes Lyndon Code, MD    Family History Family History  Problem Relation Age of Onset  . Breast cancer Maternal Grandmother 47    Social History Social History   Tobacco Use  . Smoking status: Current Every Day Smoker    Packs/day: 1.00    Types: Cigarettes  . Smokeless tobacco: Never Used  . Tobacco comment: HAS TRIED TO QUIT   Vaping Use  . Vaping Use: Never used  Substance Use Topics  . Alcohol use: Yes    Comment: socially  . Drug use: No     Allergies   Patient has no known allergies.   Review of Systems Review of Systems  Constitutional: Positive for chills, diaphoresis,  fatigue and fever.  Gastrointestinal: Positive for nausea. Negative for abdominal pain and vomiting.  Genitourinary: Positive for decreased urine volume, dysuria, frequency and urgency. Negative for hematuria.  Musculoskeletal: Positive for back pain.     Physical Exam Triage Vital Signs ED Triage Vitals  Enc Vitals Group     BP 11/05/20 1031 126/67     Pulse Rate 11/05/20 1031 82     Resp 11/05/20 1031 18     Temp 11/05/20 1031 98.1 F (36.7 C)     Temp Source 11/05/20 1031 Oral     SpO2 11/05/20 1031 98 %     Weight 11/05/20 1029 250 lb 3.6 oz (113.5 kg)     Height 11/05/20 1029 5\' 8"  (1.727 m)     Head Circumference --      Peak Flow --      Pain Score 11/05/20 1023 0     Pain Loc --      Pain Edu? --      Excl. in GC? --    No data found.  Updated Vital Signs BP 126/67 (BP Location: Left Arm)   Pulse 82   Temp 98.1 F (36.7 C) (Oral)   Resp 18   Ht 5\' 8"  (1.727 m)   Wt 250 lb 3.6 oz (113.5 kg)   SpO2 98%   BMI 38.05 kg/m   Visual Acuity Right Eye Distance:   Left Eye Distance:   Bilateral Distance:    Right Eye Near:   Left Eye Near:    Bilateral Near:     Physical Exam Vitals reviewed.  Constitutional:      Appearance: Normal appearance. She is ill-appearing.  HENT:     Head: Normocephalic and atraumatic.  Cardiovascular:     Rate and Rhythm: Normal rate and regular rhythm.     Pulses: Normal pulses.     Heart sounds: Normal heart sounds. No murmur heard. No gallop.   Pulmonary:     Effort: Pulmonary effort is normal.     Breath sounds: Normal breath sounds. No wheezing or rales.  Abdominal:     Tenderness: There is no right CVA tenderness.  Skin:    General: Skin is warm and dry.     Capillary Refill: Capillary refill takes less than 2 seconds.     Findings: No erythema or rash.  Neurological:     General: No focal deficit present.     Mental Status: She is alert.  Psychiatric:        Mood and Affect: Mood normal.        Behavior:  Behavior normal.        Thought Content: Thought content normal.        Judgment: Judgment normal.      UC Treatments / Results  Labs (all labs ordered are listed, but only abnormal  results are displayed) Labs Reviewed  URINALYSIS, COMPLETE (UACMP) WITH MICROSCOPIC - Abnormal; Notable for the following components:      Result Value   APPearance CLOUDY (*)    Specific Gravity, Urine >1.030 (*)    Hgb urine dipstick LARGE (*)    Bilirubin Urine SMALL (*)    Ketones, ur TRACE (*)    Protein, ur 100 (*)    Leukocytes,Ua MODERATE (*)    Bacteria, UA MANY (*)    All other components within normal limits  URINE CULTURE    EKG   Radiology No results found.  Procedures Procedures (including critical care time)  Medications Ordered in UC Medications - No data to display  Initial Impression / Assessment and Plan / UC Course  I have reviewed the triage vital signs and the nursing notes.  Pertinent labs & imaging results that were available during my care of the patient were reviewed by me and considered in my medical decision making (see chart for details).   Patient is a very pleasant 60 year old female here for evaluation of UTI symptoms that been going on for the past 2 weeks.  Patient reports that her symptoms drastically worsened last night to include rigors, chills, sweats, and fever.  Patient states that she feels just awful and she does appear to not feel well.  Physical exam reveals clear lung sounds in all fields, no CVA tenderness, and a soft abdomen.  Urine collected at triage.  Analysis shows large hemoglobin, trace ketones, 100 protein, moderate leukocytes, greater than 50 WBCs and RBCs, many bacteria.  Will send urine for culture.  Patient's physical exam is consistent with pyelonephritis.  Will treat with Cipro twice daily x7 days.  ER precautions reviewed with patient and she verbalizes understanding.  Work note provided.   Final Clinical Impressions(s) / UC  Diagnoses   Final diagnoses:  Acute pyelonephritis     Discharge Instructions     Take the Cipro twice daily for 7 days for treatment of your pyelonephritis.  Continue to use Tylenol as needed for fever and chills.  If you develop vomiting and are unable to keep down fluids or medications you need to be seen in the emergency department as you may need IV fluids and IV antibiotics.    ED Prescriptions    Medication Sig Dispense Auth. Provider   ciprofloxacin (CIPRO) 500 MG tablet Take 1 tablet (500 mg total) by mouth 2 (two) times daily for 7 days. 14 tablet Becky Augusta, NP     PDMP not reviewed this encounter.   Becky Augusta, NP 11/05/20 1128

## 2020-11-05 NOTE — Discharge Instructions (Addendum)
Take the Cipro twice daily for 7 days for treatment of your pyelonephritis.  Continue to use Tylenol as needed for fever and chills.  If you develop vomiting and are unable to keep down fluids or medications you need to be seen in the emergency department as you may need IV fluids and IV antibiotics.

## 2020-11-05 NOTE — ED Triage Notes (Signed)
Patient states that she has been having urinary urgency, frequency, decreased output x 2 weeks. States that she started running a fever and back pain last night.

## 2020-11-06 ENCOUNTER — Other Ambulatory Visit: Payer: Self-pay | Admitting: Rheumatology

## 2020-11-07 LAB — URINE CULTURE
Culture: >=100000 — AB
Special Requests: NORMAL

## 2020-11-08 ENCOUNTER — Other Ambulatory Visit: Payer: Self-pay

## 2020-11-08 ENCOUNTER — Inpatient Hospital Stay
Admission: EM | Admit: 2020-11-08 | Discharge: 2020-11-09 | DRG: 690 | Disposition: A | Discharge status: Home / Self Care | Payer: 59 | Attending: Internal Medicine | Admitting: Internal Medicine

## 2020-11-08 ENCOUNTER — Emergency Department: Payer: 59

## 2020-11-08 ENCOUNTER — Inpatient Hospital Stay: Payer: 59

## 2020-11-08 ENCOUNTER — Encounter: Payer: Self-pay | Admitting: Emergency Medicine

## 2020-11-08 DIAGNOSIS — E782 Mixed hyperlipidemia: Secondary | ICD-10-CM | POA: Diagnosis not present

## 2020-11-08 DIAGNOSIS — R059 Cough, unspecified: Secondary | ICD-10-CM | POA: Diagnosis not present

## 2020-11-08 DIAGNOSIS — N10 Acute tubulo-interstitial nephritis: Principal | ICD-10-CM | POA: Diagnosis present

## 2020-11-08 DIAGNOSIS — F17200 Nicotine dependence, unspecified, uncomplicated: Secondary | ICD-10-CM | POA: Diagnosis not present

## 2020-11-08 DIAGNOSIS — N390 Urinary tract infection, site not specified: Secondary | ICD-10-CM

## 2020-11-08 DIAGNOSIS — B9620 Unspecified Escherichia coli [E. coli] as the cause of diseases classified elsewhere: Secondary | ICD-10-CM | POA: Diagnosis present

## 2020-11-08 DIAGNOSIS — I700 Atherosclerosis of aorta: Secondary | ICD-10-CM | POA: Diagnosis present

## 2020-11-08 DIAGNOSIS — Z79899 Other long term (current) drug therapy: Secondary | ICD-10-CM | POA: Diagnosis not present

## 2020-11-08 DIAGNOSIS — K5730 Diverticulosis of large intestine without perforation or abscess without bleeding: Secondary | ICD-10-CM | POA: Diagnosis not present

## 2020-11-08 DIAGNOSIS — I714 Abdominal aortic aneurysm, without rupture: Secondary | ICD-10-CM | POA: Diagnosis not present

## 2020-11-08 DIAGNOSIS — F331 Major depressive disorder, recurrent, moderate: Secondary | ICD-10-CM | POA: Diagnosis not present

## 2020-11-08 DIAGNOSIS — D849 Immunodeficiency, unspecified: Secondary | ICD-10-CM | POA: Diagnosis present

## 2020-11-08 DIAGNOSIS — F17210 Nicotine dependence, cigarettes, uncomplicated: Secondary | ICD-10-CM | POA: Diagnosis present

## 2020-11-08 DIAGNOSIS — K219 Gastro-esophageal reflux disease without esophagitis: Secondary | ICD-10-CM | POA: Diagnosis not present

## 2020-11-08 DIAGNOSIS — M069 Rheumatoid arthritis, unspecified: Secondary | ICD-10-CM | POA: Diagnosis present

## 2020-11-08 DIAGNOSIS — Z23 Encounter for immunization: Secondary | ICD-10-CM

## 2020-11-08 DIAGNOSIS — R319 Hematuria, unspecified: Secondary | ICD-10-CM

## 2020-11-08 DIAGNOSIS — N3090 Cystitis, unspecified without hematuria: Secondary | ICD-10-CM | POA: Diagnosis not present

## 2020-11-08 DIAGNOSIS — N2889 Other specified disorders of kidney and ureter: Secondary | ICD-10-CM | POA: Diagnosis not present

## 2020-11-08 DIAGNOSIS — Z20822 Contact with and (suspected) exposure to covid-19: Secondary | ICD-10-CM | POA: Diagnosis present

## 2020-11-08 DIAGNOSIS — R509 Fever, unspecified: Secondary | ICD-10-CM | POA: Diagnosis not present

## 2020-11-08 LAB — CBC WITH DIFFERENTIAL/PLATELET
Abs Immature Granulocytes: 0.02 10*3/uL (ref 0.00–0.07)
Basophils Absolute: 0.1 10*3/uL (ref 0.0–0.1)
Basophils Relative: 1 %
Eosinophils Absolute: 0.5 10*3/uL (ref 0.0–0.5)
Eosinophils Relative: 4 %
HCT: 40.6 % (ref 36.0–46.0)
Hemoglobin: 13.7 g/dL (ref 12.0–15.0)
Immature Granulocytes: 0 %
Lymphocytes Relative: 19 %
Lymphs Abs: 2.2 10*3/uL (ref 0.7–4.0)
MCH: 31.4 pg (ref 26.0–34.0)
MCHC: 33.7 g/dL (ref 30.0–36.0)
MCV: 93.1 fL (ref 80.0–100.0)
Monocytes Absolute: 1.8 10*3/uL — ABNORMAL HIGH (ref 0.1–1.0)
Monocytes Relative: 15 %
Neutro Abs: 7.3 10*3/uL (ref 1.7–7.7)
Neutrophils Relative %: 61 %
Platelets: 185 10*3/uL (ref 150–400)
RBC: 4.36 MIL/uL (ref 3.87–5.11)
RDW: 14.6 % (ref 11.5–15.5)
WBC: 11.9 10*3/uL — ABNORMAL HIGH (ref 4.0–10.5)
nRBC: 0 % (ref 0.0–0.2)

## 2020-11-08 LAB — BASIC METABOLIC PANEL
Anion gap: 10 (ref 5–15)
BUN: 11 mg/dL (ref 8–23)
CO2: 23 mmol/L (ref 22–32)
Calcium: 8.6 mg/dL — ABNORMAL LOW (ref 8.9–10.3)
Chloride: 105 mmol/L (ref 98–111)
Creatinine, Ser: 0.6 mg/dL (ref 0.44–1.00)
GFR, Estimated: >60 mL/min (ref >60)
Glucose, Bld: 103 mg/dL — ABNORMAL HIGH (ref 70–99)
Potassium: 3.7 mmol/L (ref 3.5–5.1)
Sodium: 138 mmol/L (ref 135–145)

## 2020-11-08 LAB — BRAIN NATRIURETIC PEPTIDE: B Natriuretic Peptide: 48.2 pg/mL (ref 0.0–100.0)

## 2020-11-08 LAB — URINALYSIS, COMPLETE (UACMP) WITH MICROSCOPIC
Bilirubin Urine: NEGATIVE
Glucose, UA: NEGATIVE mg/dL
Ketones, ur: NEGATIVE mg/dL
Leukocytes,Ua: NEGATIVE
Nitrite: NEGATIVE
Protein, ur: NEGATIVE mg/dL
Specific Gravity, Urine: 1.013 (ref 1.005–1.030)
pH: 7 (ref 5.0–8.0)

## 2020-11-08 LAB — LACTIC ACID, PLASMA
Lactic Acid, Venous: 1.1 mmol/L (ref 0.5–1.9)
Lactic Acid, Venous: 0.9 mmol/L (ref 0.5–1.9)

## 2020-11-08 LAB — RESP PANEL BY RT-PCR (FLU A&B, COVID) ARPGX2
Influenza A by PCR: NEGATIVE
Influenza B by PCR: NEGATIVE
SARS Coronavirus 2 by RT PCR: NEGATIVE

## 2020-11-08 LAB — HIV ANTIBODY (ROUTINE TESTING W REFLEX): HIV Screen 4th Generation wRfx: NONREACTIVE

## 2020-11-08 MED ORDER — SODIUM CHLORIDE 0.9 % IV SOLN
1.0000 g | Freq: Once | INTRAVENOUS | Status: AC
  Administered 2020-11-08: 1 g via INTRAVENOUS
  Filled 2020-11-08: qty 10

## 2020-11-08 MED ORDER — LACTATED RINGERS IV BOLUS
1000.0000 mL | Freq: Once | INTRAVENOUS | Status: AC
  Administered 2020-11-08: 1000 mL via INTRAVENOUS

## 2020-11-08 MED ORDER — ONDANSETRON HCL 4 MG/2ML IJ SOLN: 4.0000 mg | Freq: Three times a day (TID) | INTRAMUSCULAR | Status: DC | PRN

## 2020-11-08 MED ORDER — PNEUMOCOCCAL VAC POLYVALENT 25 MCG/0.5ML IJ INJ
0.5000 mL | INJECTION | INTRAMUSCULAR | Status: AC
  Administered 2020-11-09: 0.5 mL via INTRAMUSCULAR
  Filled 2020-11-08: qty 0.5

## 2020-11-08 MED ORDER — ONDANSETRON HCL 4 MG/2ML IJ SOLN
4.0000 mg | Freq: Once | INTRAMUSCULAR | Status: DC
  Filled 2020-11-08: qty 2

## 2020-11-08 MED ORDER — LORATADINE 10 MG PO TABS
10.0000 mg | ORAL_TABLET | Freq: Every day | ORAL | Status: DC
  Administered 2020-11-08 – 2020-11-09 (×2): 10 mg via ORAL
  Filled 2020-11-08 (×2): qty 1

## 2020-11-08 MED ORDER — ALBUTEROL SULFATE HFA 108 (90 BASE) MCG/ACT IN AERS
2.0000 | INHALATION_SPRAY | RESPIRATORY_TRACT | Status: DC | PRN
  Filled 2020-11-08: qty 6.7

## 2020-11-08 MED ORDER — HEPARIN SODIUM (PORCINE) 5000 UNIT/ML IJ SOLN
5000.0000 [IU] | Freq: Three times a day (TID) | INTRAMUSCULAR | Status: DC
  Administered 2020-11-08 – 2020-11-09 (×3): 5000 [IU] via SUBCUTANEOUS
  Filled 2020-11-08 (×4): qty 1

## 2020-11-08 MED ORDER — DM-GUAIFENESIN ER 30-600 MG PO TB12
1.0000 | ORAL_TABLET | Freq: Two times a day (BID) | ORAL | Status: DC | PRN
  Administered 2020-11-09: 1 via ORAL
  Filled 2020-11-08: qty 1

## 2020-11-08 MED ORDER — LACTATED RINGERS IV SOLN: INTRAVENOUS | Status: DC

## 2020-11-08 MED ORDER — ROSUVASTATIN CALCIUM 10 MG PO TABS
5.0000 mg | ORAL_TABLET | Freq: Every day | ORAL | Status: DC
  Administered 2020-11-08: 5 mg via ORAL
  Filled 2020-11-08: qty 1

## 2020-11-08 MED ORDER — NICOTINE 21 MG/24HR TD PT24
21.0000 mg | MEDICATED_PATCH | Freq: Once | TRANSDERMAL | Status: AC
  Administered 2020-11-08: 21 mg via TRANSDERMAL
  Filled 2020-11-08: qty 1

## 2020-11-08 MED ORDER — SODIUM CHLORIDE 0.9 % IV SOLN
1.0000 g | Freq: Once | INTRAVENOUS | Status: AC
  Administered 2020-11-08: 1 g via INTRAVENOUS
  Filled 2020-11-08: qty 1

## 2020-11-08 MED ORDER — FOLIC ACID 1 MG PO TABS
1.0000 mg | ORAL_TABLET | Freq: Every day | ORAL | Status: DC
Start: 2020-11-08 — End: 2020-11-09
  Administered 2020-11-08 – 2020-11-09 (×2): 1 mg via ORAL
  Filled 2020-11-08 (×2): qty 1

## 2020-11-08 MED ORDER — KETOROLAC TROMETHAMINE 30 MG/ML IJ SOLN
15.0000 mg | Freq: Once | INTRAMUSCULAR | Status: DC
  Filled 2020-11-08: qty 1

## 2020-11-08 MED ORDER — OXYCODONE-ACETAMINOPHEN 5-325 MG PO TABS
1.0000 | ORAL_TABLET | ORAL | Status: DC | PRN
Start: 2020-11-08 — End: 2020-11-09

## 2020-11-08 MED ORDER — ACETAMINOPHEN 325 MG PO TABS
650.0000 mg | ORAL_TABLET | Freq: Four times a day (QID) | ORAL | Status: DC | PRN
  Administered 2020-11-08: 650 mg via ORAL
  Filled 2020-11-08: qty 2

## 2020-11-08 MED ORDER — DESVENLAFAXINE SUCCINATE ER 50 MG PO TB24
150.0000 mg | ORAL_TABLET | Freq: Every day | ORAL | Status: DC
Start: 2020-11-08 — End: 2020-11-09
  Administered 2020-11-08 – 2020-11-09 (×2): 150 mg via ORAL
  Filled 2020-11-08 (×2): qty 3

## 2020-11-08 MED ORDER — ACETAMINOPHEN 500 MG PO TABS
1000.0000 mg | ORAL_TABLET | Freq: Once | ORAL | Status: AC
  Administered 2020-11-08: 1000 mg via ORAL

## 2020-11-08 MED ORDER — PANTOPRAZOLE SODIUM 40 MG PO TBEC
40.0000 mg | DELAYED_RELEASE_TABLET | Freq: Every day | ORAL | Status: DC
  Administered 2020-11-08 – 2020-11-09 (×2): 40 mg via ORAL
  Filled 2020-11-08 (×2): qty 1

## 2020-11-08 MED ORDER — SODIUM CHLORIDE 0.9 % IV SOLN
2.0000 g | INTRAVENOUS | Status: DC
  Administered 2020-11-09: 2 g via INTRAVENOUS
  Filled 2020-11-08: qty 2
  Filled 2020-11-08: qty 20

## 2020-11-08 NOTE — ED Provider Notes (Signed)
Pleasant 60 yo F here with R flank pain in setting of recent UTI on Cipro. Labs show moderate leukocytosis. Pt tachycardic, nauseous despite fluids and >24 hr of PO ABX. CT stone obtained and shows R Pyelo, no overt stone or obstruction. Will admit for IVF, abx, symptom control. COVID negative.   Shaune Pollack, MD 11/08/20 [phone]

## 2020-11-08 NOTE — ED Provider Notes (Signed)
[facility] Emergency Department Provider Note   ____________________________________________   I have reviewed the triage vital signs and the nursing notes.   HISTORY  Chief Complaint Fever   History limited by: Not Limited   HPI Karen Castro is a 60 y.o. female who presents to the emergency department today because of concern for continued urinary tract infection. The patient was seen at urgent care 3 days ago and diagnosed with pyelonephritis. She was put on cipro. She states she has taken the cipro without any improvement. Her symptoms include increased urinary frequency and back pain. The patient has also been having fevers. The fevers have been present mainly at night. The patient states that she has rheumatoid arthritis and has also been having body aches.    Records reviewed. Per medical record review patient has a history of recent visit to urgent care with diagnosis of pyelonephritis.   Past Medical History:  Diagnosis Date  . Arthritis   . Hypercholesteremia     Patient Active Problem List   Diagnosis Date Noted  . Encounter for general adult medical examination with abnormal findings 03/05/2020  . Vitamin D deficiency 11/22/2019  . Mild intermittent asthma without complication 07/25/2019  . Cellulitis and abscess of foot 03/02/2019  . Atopic dermatitis 03/02/2019  . Nausea and vomiting 01/21/2019  . Excessive daytime sleepiness 07/20/2018  . Moderate recurrent major depression (HCC) 07/20/2018  . Gastro-esophageal reflux disease without esophagitis 07/20/2018  . Screening for breast cancer 01/14/2018  . Acute upper respiratory infection 01/14/2018  . Mixed hyperlipidemia 01/14/2018  . Recurrent major depressive disorder, in partial remission (HCC) 01/14/2018  . Rheumatoid arthritis (HCC) 01/14/2018  . Dysuria 01/14/2018    Past Surgical History:  Procedure Laterality Date  . ABDOMINAL HYSTERECTOMY    . BREAST BIOPSY Right     core- neg    Prior to Admission medications   Medication Sig Start Date End Date Taking? Authorizing Provider  Adalimumab (HUMIRA PEN) 40 MG/0.4ML PNKT Inject 40 mg into the skin every 14 (fourteen) days. 03/27/20   Quentin Angst, MD  albuterol (VENTOLIN HFA) 108 (90 Base) MCG/ACT inhaler Inhale 2 puffs into the lungs every 6 (six) hours as needed for wheezing or shortness of breath. 11/22/19   Carlean Jews, NP  ciprofloxacin (CIPRO) 500 MG tablet Take 1 tablet (500 mg total) by mouth 2 (two) times daily for 7 days. 11/05/20 11/12/20  Becky Augusta, NP  desvenlafaxine (PRISTIQ) 50 MG 24 hr tablet Take 3 tablets by mouth daily 10/18/20   Theotis Burrow, NP  Desvenlafaxine ER (PRISTIQ) 50 MG TB24 Take 3 tablets (150 mg total) by mouth daily. 10/08/20   Theotis Burrow, NP  fexofenadine (ALLEGRA) 180 MG tablet Take 180 mg by mouth daily.    [provider]  folic acid (FOLVITE) 1 MG tablet Take 1 mg by mouth daily.  01/19/17   [provider]  methotrexate (RHEUMATREX) 2.5 MG tablet Take by mouth once a week.  12/07/17   [provider]  nystatin (MYCOSTATIN/NYSTOP) powder Apply topically 4 (four) times daily. 08/17/19   Carlean Jews, NP  omeprazole (PRILOSEC) 20 MG capsule Take 1 capsule (20 mg total) by mouth daily. 10/08/20   Theotis Burrow, NP  rosuvastatin (CRESTOR) 5 MG tablet Take 1 tablet (5 mg total) by mouth daily with supper. 08/09/20   Lyndon Code, MD    Allergies Patient has no known allergies.  Family History  Problem Relation Age  of Onset  . Breast cancer Maternal Grandmother 38    Social History Social History   Tobacco Use  . Smoking status: Current Every Day Smoker    Packs/day: 1.00    Types: Cigarettes  . Smokeless tobacco: Never Used  . Tobacco comment: HAS TRIED TO QUIT   Vaping Use  . Vaping Use: Never used  Substance Use Topics  . Alcohol use: Yes    Comment: socially  . Drug use: No    Review of  Systems Constitutional: Positive for fever.  Eyes: No visual changes. ENT: No sore throat. Cardiovascular: Denies chest pain. Respiratory: Positive for cough.  Gastrointestinal: Positive for abdominal pain.    Genitourinary: Negative for dysuria. Musculoskeletal: Positive for joint pain. Positive for back pain.  Skin: Negative for rash. Neurological: Negative for headaches, focal weakness or numbness.  ____________________________________________   PHYSICAL EXAM:  VITAL SIGNS: ED Triage Vitals  Enc Vitals Group     BP 11/08/20 0444 (!) 136/92     Pulse Rate 11/08/20 0444 (!) 113     Resp 11/08/20 0444 18     Temp 11/08/20 0444 99.8 F (37.7 C)     Temp Source 11/08/20 0444 Oral     SpO2 11/08/20 0444 99 %     Weight 11/08/20 0445 250 lb 3.6 oz (113.5 kg)     Height 11/08/20 0445 5\' 8"  (1.727 m)     Head Circumference --      Peak Flow --      Pain Score 11/08/20 0453 4   Constitutional: Alert and oriented.  Eyes: Conjunctivae are normal.  ENT      Head: Normocephalic and atraumatic.      Nose: No congestion/rhinnorhea.      Mouth/Throat: Mucous membranes are moist.      Neck: No stridor. Hematological/Lymphatic/Immunilogical: No cervical lymphadenopathy. Cardiovascular: Normal rate, regular rhythm.  No murmurs, rubs, or gallops.  Respiratory: Normal respiratory effort without tachypnea nor retractions. Breath sounds are clear and equal bilaterally. No wheezes/rales/rhonchi. Gastrointestinal: Soft and non tender. No rebound. No guarding.  Genitourinary: Deferred Musculoskeletal: Normal range of motion in all extremities. No lower extremity edema. No CVA tenderness.  Neurologic:  Normal speech and language. No gross focal neurologic deficits are appreciated.  Skin:  Skin is warm, dry and intact. No rash noted. Psychiatric: Mood and affect are normal. Speech and behavior are normal. Patient exhibits appropriate insight and  judgment.  ____________________________________________    LABS (pertinent positives/negatives)  Lactic acid 1.1 BMP wnl except glu 103, ca 8.6 UA hazy, large hgb dipstick, 21-50 rbc and wbc, rare bacteria CBC wbc 11.9, hgb 13.7, plt 185 ____________________________________________   EKG  None  ____________________________________________    RADIOLOGY  None   ____________________________________________   PROCEDURES  Procedures  ____________________________________________   INITIAL IMPRESSION / ASSESSMENT AND PLAN / ED COURSE  Pertinent labs & imaging results that were available during my care of the patient were reviewed by me and considered in my medical decision making (see chart for details).   Patient presented to the emergency department today because of concerns for continued fever and urinary symptoms.  Patient states that she was started on antibiotics for pyelonephritis 3 days ago.  Patient was found to be tachycardic here in the emergency department.  Blood work showed a mild leukocytosis and no lactic acidosis.  Urine continues to show signs of infection.  Urine however continues to also show blood.  Given the patient is complaining of pain do have  concerns for possible kidney or ureteral stone.  Will obtain CT scan to evaluate.  Regardless I do think patient would likely benefit from inpatient admission for continued IV antibiotics.  ____________________________________________   FINAL CLINICAL IMPRESSION(S) / ED DIAGNOSES  Final diagnoses:  Urinary tract infection with hematuria, site unspecified     Note: This dictation was prepared with Dragon dictation. Any transcriptional errors that result from this process are unintentional     Phineas Semen, MD 11/08/20 [phone]

## 2020-11-08 NOTE — ED Triage Notes (Signed)
Patient ambulatory to triage with steady gait, without difficulty or distress noted; pt seen at [facility] Monday and dx with pylonephritis; cont to have fever, currently taking cipro; advil taken at 3am

## 2020-11-08 NOTE — H&P (Signed)
History and Physical    Karen Castro UJW:[id number] DOB: 1961-07-12 DOA: 11/08/2020  Referring MD/NP/PA:   PCP: Lyndon Code, MD   Patient coming from:  The patient is coming from home.  At baseline, pt is independent for most of ADL.        Chief Complaint: right flank pain, increased urinary frequency, cough, fever  HPI: Karen Castro is a 60 y.o. female with medical history significant of hyperlipidemia, GERD, depression, rheumatoid arthritis on immunosuppressant,, smoking, who presents with right flank pain, increased urinary frequency, cough, fever.  Patient states that she has been having right flank pain, increased urinary frequency, intermittent fever, chills for about 1 week.  She was diagnosed as acute pyelonephritis at urgent care, and started Cipro on 11/05/2020.  She has been taking this antibiotics, but no improvement.  She continues to have increased urinary frequency, intermittent fever and chills.  No dysuria or burning on urination.  Of note, urine culture obtained at [facility] on 3/7 was positive for E. coli which is sensitive to Rocephin. Patient also reports productive cough with yellow-colored sputum production, denies chest pain, shortness breath.  Patient states that at the beginning of her illness, she had nausea and vomiting which had resolved.  Currently no nausea, vomiting, diarrhea or abdominal pain.   ED Course: pt was found to have WBC 11.9, lactic acid 1.1, urinalysis (hazy appearance, negative leukocyte, rare bacteria, WBC 21-50), negative COVID PCR, electrolytes renal function okay, temperature 99.8, blood pressure 169/71, heart rate 113, RR 18, oxygen saturation 99% on room air.  CT per renal stone protocol showed right pyelonephritis without overt stone or obstruction. Patient is admitted to MedSurg bed as inpatient  CT-renal stone study:  1. Inflamed right kidney strongly suggesting Acute Pyelonephritis in this setting, although IV contrast is necessary  for that CT diagnosis. Inflammation tracks along the right ureter which does appear mildly dilated although no urinary calculus or obstructing etiology is identified. The bladder may also be mildly inflamed.  2. Negative noncontrast left kidney and ureter.  3. Aortic Atherosclerosis (ICD10-I70.0) with mild infrarenal abdominal aortic aneurysm, 29 mm. Recommend follow-up ultrasound every 5 years. This recommendation follows ACR consensus guidelines: White Paper of the ACR Incidental Findings Committee II on Vascular Findings. J Am Coll Radiol 2013; 10:789-794.    Review of Systems:   General: has fevers, chills, no body weight gain, has fatigue HEENT: no blurry vision, hearing changes or sore throat Respiratory: no dyspnea, has coughing, no wheezing CV: no chest pain, no palpitations GI: no nausea, vomiting, abdominal pain, diarrhea, constipation GU: no dysuria, burning on urination, has increased urinary frequency, no hematuria  Ext: no leg edema Neuro: no unilateral weakness, numbness, or tingling, no vision change or hearing loss Skin: no rash, no skin tear. MSK: has right flank pain Heme: No easy bruising.  Travel history: No recent long distant travel.  Allergy: No Known Allergies  Past Medical History:  Diagnosis Date  . Arthritis   . Hypercholesteremia     Past Surgical History:  Procedure Laterality Date  . ABDOMINAL HYSTERECTOMY    . BREAST BIOPSY Right    core- neg    Social History:  reports that she has been smoking cigarettes. She has been smoking about 1.00 pack per day. She has never used smokeless tobacco. She reports current alcohol use. She reports that she does not use drugs.  Family History:  Family History  Problem Relation Age of Onset  . Breast cancer  Maternal Grandmother 14     Prior to Admission medications   Medication Sig Start Date End Date Taking? Authorizing Provider  Adalimumab (HUMIRA PEN) 40 MG/0.4ML PNKT Inject 40 mg into the  skin every 14 (fourteen) days. 03/27/20   Quentin Angst, MD  albuterol (VENTOLIN HFA) 108 (90 Base) MCG/ACT inhaler Inhale 2 puffs into the lungs every 6 (six) hours as needed for wheezing or shortness of breath. 11/22/19   Carlean Jews, NP  ciprofloxacin (CIPRO) 500 MG tablet Take 1 tablet (500 mg total) by mouth 2 (two) times daily for 7 days. 11/05/20 11/12/20  Becky Augusta, NP  desvenlafaxine (PRISTIQ) 50 MG 24 hr tablet Take 3 tablets by mouth daily 10/18/20   Theotis Burrow, NP  Desvenlafaxine ER (PRISTIQ) 50 MG TB24 Take 3 tablets (150 mg total) by mouth daily. 10/08/20   Theotis Burrow, NP  fexofenadine (ALLEGRA) 180 MG tablet Take 180 mg by mouth daily.    [provider]  folic acid (FOLVITE) 1 MG tablet Take 1 mg by mouth daily.  01/19/17   [provider]  methotrexate (RHEUMATREX) 2.5 MG tablet Take by mouth once a week.  12/07/17   [provider]  nystatin (MYCOSTATIN/NYSTOP) powder Apply topically 4 (four) times daily. 08/17/19   Carlean Jews, NP  omeprazole (PRILOSEC) 20 MG capsule Take 1 capsule (20 mg total) by mouth daily. 10/08/20   Theotis Burrow, NP  rosuvastatin (CRESTOR) 5 MG tablet Take 1 tablet (5 mg total) by mouth daily with supper. 08/09/20   Lyndon Code, MD    Physical Exam: Vitals:   11/08/20 0444 11/08/20 0445 11/08/20 0530  BP: (!) 136/92  (!) 169/71  Pulse: (!) 113  (!) 111  Resp: 18  18  Temp: 99.8 F (37.7 C)    TempSrc: Oral    SpO2: 99%  99%  Weight:  113.5 kg   Height:  5\' 8"  (1.727 m)    General: Not in acute distress HEENT:       Eyes: PERRL, EOMI, no scleral icterus.       ENT: No discharge from the ears and nose, no pharynx injection, no tonsillar enlargement.        Neck: No JVD, no bruit, no mass felt. Heme: No neck lymph node enlargement. Cardiac: S1/S2, RRR, No murmurs, No gallops or rubs. Respiratory: Scattered rhonchi GI: Soft, nondistended, nontender, no rebound pain, no organomegaly, BS  present. GU: No hematuria. Ext: No pitting leg edema bilaterally. 1+DP/PT pulse bilaterally. Musculoskeletal: No joint deformities, No joint redness or warmth, no limitation of ROM in spin. Skin: No rashes.  Neuro: Alert, oriented X3, cranial nerves II-XII grossly intact, moves all extremities normally.  Labs on Admission: I have personally reviewed following labs and imaging studies  CBC: Recent Labs  Lab 11/08/20 0528  WBC 11.9*  NEUTROABS 7.3  HGB 13.7  HCT 40.6  MCV 93.1  PLT 185   Basic Metabolic Panel: Recent Labs  Lab 11/08/20 0528  NA 138  K 3.7  CL 105  CO2 23  GLUCOSE 103*  BUN 11  CREATININE 0.60  CALCIUM 8.6*   GFR: Estimated Creatinine Clearance: 97.6 mL/min (by C-G formula based on SCr of 0.6 mg/dL). Liver Function Tests: No results for input(s): AST, ALT, ALKPHOS, BILITOT, PROT, ALBUMIN in the last 168 hours. No results for input(s): LIPASE, AMYLASE in the last 168 hours. No results for input(s): AMMONIA in the last 168 hours. Coagulation Profile: No results  for input(s): INR, PROTIME in the last 168 hours. Cardiac Enzymes: No results for input(s): CKTOTAL, CKMB, CKMBINDEX, TROPONINI in the last 168 hours. BNP (last 3 results) No results for input(s): PROBNP in the last 8760 hours. HbA1C: No results for input(s): HGBA1C in the last 72 hours. CBG: No results for input(s): GLUCAP in the last 168 hours. Lipid Profile: No results for input(s): CHOL, HDL, LDLCALC, TRIG, CHOLHDL, LDLDIRECT in the last 72 hours. Thyroid Function Tests: No results for input(s): TSH, T4TOTAL, FREET4, T3FREE, THYROIDAB in the last 72 hours. Anemia Panel: No results for input(s): VITAMINB12, FOLATE, FERRITIN, TIBC, IRON, RETICCTPCT in the last 72 hours. Urine analysis:    Component Value Date/Time   COLORURINE YELLOW (A) 11/08/2020 0528   APPEARANCEUR HAZY (A) 11/08/2020 0528   APPEARANCEUR Clear 02/28/2020 0834   LABSPEC 1.013 11/08/2020 0528   PHURINE 7.0 11/08/2020  0528   GLUCOSEU NEGATIVE 11/08/2020 0528   HGBUR LARGE (A) 11/08/2020 0528   BILIRUBINUR NEGATIVE 11/08/2020 0528   BILIRUBINUR Negative 02/28/2020 0834   KETONESUR NEGATIVE 11/08/2020 0528   PROTEINUR NEGATIVE 11/08/2020 0528   NITRITE NEGATIVE 11/08/2020 0528   LEUKOCYTESUR NEGATIVE 11/08/2020 0528   Sepsis Labs: @LABRCNTIP (procalcitonin:4,lacticidven:4) ) Recent Results (from the past 240 hour(s))  Urine culture     Status: Abnormal   Collection Time: 11/05/20 10:22 AM   Specimen: Urine, Clean Catch  Result Value Ref Range Status   Specimen Description   Final    URINE, CLEAN CATCH Performed at [facility], [address] [place], [place]    Special Requests   Final    Normal Performed at [facility], [address] [place], [place]    Culture >=100,000 COLONIES/mL ESCHERICHIA COLI (A)  Final   Report Status 11/07/2020 FINAL  Final   Organism ID, Bacteria ESCHERICHIA COLI (A)  Final      Susceptibility   Escherichia coli - MIC*    AMPICILLIN >=32 RESISTANT Resistant     CEFAZOLIN <=4 SENSITIVE Sensitive     CEFEPIME <=0.12 SENSITIVE Sensitive     CEFTRIAXONE <=0.25 SENSITIVE Sensitive     CIPROFLOXACIN <=0.25 SENSITIVE Sensitive     GENTAMICIN <=1 SENSITIVE Sensitive     IMIPENEM <=0.25 SENSITIVE Sensitive     NITROFURANTOIN <=16 SENSITIVE Sensitive     TRIMETH/SULFA <=20 SENSITIVE Sensitive     AMPICILLIN/SULBACTAM 16 INTERMEDIATE Intermediate     PIP/TAZO <=4 SENSITIVE Sensitive     * >=100,000 COLONIES/mL ESCHERICHIA COLI  Blood culture (routine x 2)     Status: None (Preliminary result)   Collection Time: 11/08/20  5:28 AM   Specimen: BLOOD  Result Value Ref Range Status   Specimen Description BLOOD LEFT AC  Final   Special Requests   Final    BOTTLES DRAWN AEROBIC AND ANAEROBIC Blood Culture adequate volume   Culture   Final    NO GROWTH <12 HOURS Performed at [facility], [address]  [place], [place]    Report Status PENDING  Incomplete  Blood culture (routine x 2)     Status: None (Preliminary result)   Collection Time: 11/08/20  5:28 AM   Specimen: BLOOD  Result Value Ref Range Status   Specimen Description BLOOD RIGHT AC  Final   Special Requests   Final    BOTTLES DRAWN AEROBIC AND ANAEROBIC Blood Culture adequate volume   Culture   Final    NO GROWTH <12 HOURS Performed at [facility], [address] [place], [place] [address]    Report Status PENDING  Incomplete  Resp Panel by RT-PCR (Flu A&B, Covid) Nasopharyngeal Swab     Status: None   Collection Time: 11/08/20  5:48 AM   Specimen: Nasopharyngeal Swab; Nasopharyngeal(NP) swabs in vial transport medium  Result Value Ref Range Status   SARS Coronavirus 2 by RT PCR NEGATIVE NEGATIVE Final    Comment: (NOTE) SARS-CoV-2 target nucleic acids are NOT DETECTED.  The SARS-CoV-2 RNA is generally detectable in upper respiratory specimens during the acute phase of infection. The lowest concentration of SARS-CoV-2 viral copies this assay can detect is 138 copies/mL. A negative result does not preclude SARS-Cov-2 infection and should not be used as the sole basis for treatment or other patient management decisions. A negative result may occur with  improper specimen collection/handling, submission of specimen other than nasopharyngeal swab, presence of viral mutation(s) within the areas targeted by this assay, and inadequate number of viral copies(<138 copies/mL). A negative result must be combined with clinical observations, patient history, and epidemiological information. The expected result is Negative.  Fact Sheet for Patients:  BloggerCourse.com  Fact Sheet for Healthcare Providers:  SeriousBroker.it  This test is no t yet approved or cleared by the [place] FDA and  has been authorized for detection and/or diagnosis of SARS-CoV-2  by FDA under an Emergency Use Authorization (EUA). This EUA will remain  in effect (meaning this test can be used) for the duration of the COVID-19 declaration under Section 564(b)(1) of the Act, 21 U.S.C.section 360bbb-3(b)(1), unless the authorization is terminated  or revoked sooner.       Influenza A by PCR NEGATIVE NEGATIVE Final   Influenza B by PCR NEGATIVE NEGATIVE Final    Comment: (NOTE) The Xpert Xpress SARS-CoV-2/FLU/RSV plus assay is intended as an aid in the diagnosis of influenza from Nasopharyngeal swab specimens and should not be used as a sole basis for treatment. Nasal washings and aspirates are unacceptable for Xpert Xpress SARS-CoV-2/FLU/RSV testing.  Fact Sheet for Patients: BloggerCourse.com  Fact Sheet for Healthcare Providers: SeriousBroker.it  This test is not yet approved or cleared by the [place] FDA and has been authorized for detection and/or diagnosis of SARS-CoV-2 by FDA under an Emergency Use Authorization (EUA). This EUA will remain in effect (meaning this test can be used) for the duration of the COVID-19 declaration under Section 564(b)(1) of the Act, 21 U.S.C. section 360bbb-3(b)(1), unless the authorization is terminated or revoked.  Performed at [facility], [address], [place], [place] [address]      Radiological Exams on Admission: CT Renal Stone Study  Result Date: 11/08/2020 CLINICAL DATA:  60 year old female with right side abdominal pain, recently diagnosed with pyelonephritis, started on antibiotics. No improvement. EXAM: CT ABDOMEN AND PELVIS WITHOUT CONTRAST TECHNIQUE: Multidetector CT imaging of the abdomen and pelvis was performed following the standard protocol without IV contrast. COMPARISON:  None. FINDINGS: Lower chest: Cardiac size at the upper limits of normal. No pericardial effusion. Negative lung bases. Hepatobiliary: Negative noncontrast liver and  gallbladder. Pancreas: Negative. Spleen: Negative. Adrenals/Urinary Tract: Mild adrenal gland thickening compatible with adrenal hyperplasia. Indistinct and inflamed appearance of the right kidney with perinephric stranding (coronal image 84). More normal noncontrast appearance of the left kidney. No superimposed hydronephrosis. However, the proximal right ureter is asymmetrically larger and there is right periureteral stranding continuing to the pelvic inlet. However, no obstructing right ureteral calculus is identified. There are multiple pelvic phleboliths.  The left ureter remains decompressed to the bladder. There is minimal bladder wall thickening and irregularity. Stomach/Bowel: Negative rectum. Diverticulosis throughout the sigmoid colon with no active inflammation. Mild to moderate retained stool throughout the large bowel. Redundant hepatic flexure. No large bowel inflammation. Diminutive, normal appendix on coronal image 59. Decompressed terminal ileum. No dilated small bowel. Negative stomach and duodenum. No free air, free fluid, mesenteric inflammation. Vascular/Lymphatic: Tortuous, calcified abdominal aorta. Mild infrarenal abdominal aortic aneurysm, 29 mm (sagittal image 96). Calcified iliac artery atherosclerosis. Vascular patency is not evaluated in the absence of IV contrast. Reproductive: Surgically absent uterus. Diminutive or absent ovaries. Other: No pelvic free fluid. Musculoskeletal: Intermittent advanced lumbar disc degeneration, vacuum disc. No acute osseous abnormality identified. IMPRESSION: 1. Inflamed right kidney strongly suggesting Acute Pyelonephritis in this setting, although IV contrast is necessary for that CT diagnosis. Inflammation tracks along the right ureter which does appear mildly dilated although no urinary calculus or obstructing etiology is identified. The bladder may also be mildly inflamed. 2. Negative noncontrast left kidney and ureter. 3. Aortic Atherosclerosis  (ICD10-I70.0) with mild infrarenal abdominal aortic aneurysm, 29 mm. Recommend follow-up ultrasound every 5 years. This recommendation follows ACR consensus guidelines: White Paper of the ACR Incidental Findings Committee II on Vascular Findings. J Am Coll Radiol 2013; 10:789-794. 4. Sigmoid diverticulosis. Electronically Signed   By: Odessa Fleming M.D.   On: 11/08/2020 07:32     EKG: Not done in ED, will get one.   Assessment/Plan Principal Problem:   Acute pyelonephritis Active Problems:   Mixed hyperlipidemia   Rheumatoid arthritis (HCC)   Moderate recurrent major depression (HCC)   Gastro-esophageal reflux disease without esophagitis   Smoking   Cough   Acute pyelonephritis: Patient has mild leukocytosis with WBC 11.9, tachycardia with heart rate of 113, but does not meet criteria for sepsis.  Lactic acid 1.1.  Patient is immunosuppressed.  Patient is at high risk of deterioration.  -Admitted to MedSurg vaccine patient -IV Rocephin 2 g daily -Follow-up blood culture and urine culture -IV fluid: 2 L of LR, then 100 cc/h  Cough: Has productive cough with yellow-colored mucus production.  No oxygen desaturation. -As needed albuterol and Mucinex -Follow-up chest x-ray  Mixed hyperlipidemia -Crestor  Rheumatoid arthritis (HCC) -Hold methotrexate and Humira  Moderate recurrent major depression (HCC) -Continue home Pristiq  Gastro-esophageal reflux disease without esophagitis -Protonix  Smoking -Nicotine patch           DVT ppx: SQ Heparin  Code Status: Full code Family Communication: not done, no family member is at bed side. Disposition Plan:  Anticipate discharge back to previous environment Consults called:  none Admission status and Level of care: Med-Surg:   as inpt     Status is: Inpatient  Remains inpatient appropriate because:Inpatient level of care appropriate due to severity of illness.  Patient has multiple comorbidities, including rheumatoid  arthritis on immunosuppressant, now presents with acute pyelonephritis.  Patient failed outpatient oral antibiotics treatment.  Due to immunosuppressed status, patient is at high risk of deterioration.  Need to be treated in hospital for at least 2 days.   Dispo: The patient is from: Home              Anticipated d/c is to: Home              Patient currently is not medically stable to d/c.   Difficult to place patient No          Date of Service 11/08/2020  Lorretta Harp [facility]   If 7PM-7AM, please contact night-coverage [email or web address] 11/08/2020, 8:35 AM

## 2020-11-09 ENCOUNTER — Other Ambulatory Visit: Payer: Self-pay | Admitting: Internal Medicine

## 2020-11-09 LAB — CBC
HCT: 37.2 % (ref 36.0–46.0)
Hemoglobin: 12.6 g/dL (ref 12.0–15.0)
MCH: 31 pg (ref 26.0–34.0)
MCHC: 33.9 g/dL (ref 30.0–36.0)
MCV: 91.6 fL (ref 80.0–100.0)
Platelets: 178 10*3/uL (ref 150–400)
RBC: 4.06 MIL/uL (ref 3.87–5.11)
RDW: 14.4 % (ref 11.5–15.5)
WBC: 9.9 10*3/uL (ref 4.0–10.5)
nRBC: 0 % (ref 0.0–0.2)

## 2020-11-09 LAB — BASIC METABOLIC PANEL
Anion gap: 8 (ref 5–15)
BUN: 12 mg/dL (ref 8–23)
CO2: 23 mmol/L (ref 22–32)
Calcium: 8.6 mg/dL — ABNORMAL LOW (ref 8.9–10.3)
Chloride: 105 mmol/L (ref 98–111)
Creatinine, Ser: 0.6 mg/dL (ref 0.44–1.00)
GFR, Estimated: >60 mL/min (ref >60)
Glucose, Bld: 112 mg/dL — ABNORMAL HIGH (ref 70–99)
Potassium: 3.7 mmol/L (ref 3.5–5.1)
Sodium: 136 mmol/L (ref 135–145)

## 2020-11-09 LAB — URINE CULTURE: Culture: NO GROWTH

## 2020-11-09 MED ORDER — NICOTINE 21 MG/24HR TD PT24
21.0000 mg | MEDICATED_PATCH | Freq: Every day | TRANSDERMAL | Status: DC
  Administered 2020-11-09: 21 mg via TRANSDERMAL
  Filled 2020-11-09: qty 1

## 2020-11-09 MED ORDER — NICOTINE 21 MG/24HR TD PT24: 21.0000 mg | MEDICATED_PATCH | Freq: Every day | TRANSDERMAL | 0 refills | Status: DC

## 2020-11-09 NOTE — Discharge Summary (Signed)
Physician Discharge Summary  Patient ID: Karen Castro MRN: [id number] DOB/AGE: 09/07/1960 60 y.o.  Admit date: 11/08/2020 Discharge date: 11/09/2020  Admission Diagnoses:  Discharge Diagnoses:  Principal Problem:   Acute pyelonephritis Active Problems:   Mixed hyperlipidemia   Rheumatoid arthritis (HCC)   Moderate recurrent major depression (HCC)   Gastro-esophageal reflux disease without esophagitis   Smoking   Cough   Discharged Condition: good  Hospital Course:  Karen Castro is a 60 y.o. female with medical history significant of hyperlipidemia, GERD, depression, rheumatoid arthritis on immunosuppressant,, smoking, who presents with right flank pain, increased urinary frequency, cough, fever.  Her T-max was 99.8. Upon arriving the hospital, she was treated with IV fluids and Rocephin. She feels much improved today, no additional fever, she is medically stable to be discharged. She had a urine culture on 3/7, grew E. coli.  She was placed on Cipro at that time.  Cipro was susceptible based on susceptibility.  Repeat culture on 3/10 came back negative.  Blood culture also negative in 24 hours. Her chest x-ray does not show pneumonia. At this point, she is medically stable to be discharged.  She will complete her Cipro prescribed previously.     Consults: None  Significant Diagnostic Studies:None  Treatments:  Rocephin  Discharge Exam: Blood pressure (!) 125/56, pulse 83, temperature 98.2 F (36.8 C), resp. rate 16, height 5\' 8"  (1.727 m), weight 113.5 kg, SpO2 95 %. General appearance: alert and cooperative Resp: clear to auscultation bilaterally Cardio: regular rate and rhythm, S1, S2 normal, no murmur, click, rub or gallop GI: soft, non-tender; bowel sounds normal; no masses,  no organomegaly Extremities: extremities normal, atraumatic, no cyanosis or edema  Disposition: Discharge disposition: 01-Home or Self Care       Discharge Instructions    Diet  - low sodium heart healthy   Complete by: As directed    Increase activity slowly   Complete by: As directed      Allergies as of 11/09/2020   No Known Allergies     Medication List    TAKE these medications   albuterol 108 (90 Base) MCG/ACT inhaler Commonly known as: VENTOLIN HFA Inhale 2 puffs into the lungs every 6 (six) hours as needed for wheezing or shortness of breath.   ciprofloxacin 500 MG tablet Commonly known as: CIPRO Take 1 tablet (500 mg total) by mouth 2 (two) times daily for 7 days.   desvenlafaxine 50 MG 24 hr tablet Commonly known as: PRISTIQ Take 3 tablets by mouth daily What changed:   how much to take  how to take this  when to take this   fexofenadine 180 MG tablet Commonly known as: ALLEGRA Take 180 mg by mouth daily.   folic acid 1 MG tablet Commonly known as: FOLVITE Take 1 mg by mouth daily.   Humira Pen 40 MG/0.4ML Pnkt Generic drug: Adalimumab Inject 40 mg into the skin every 14 (fourteen) days.   methotrexate 2.5 MG tablet Commonly known as: RHEUMATREX Take 17.5 mg by mouth once a week. Wednesday   nicotine 21 mg/24hr patch Commonly known as: NICODERM CQ - dosed in mg/24 hours Place 1 patch (21 mg total) onto the skin daily. Start taking on: November 10, 2020   nystatin powder Commonly known as: MYCOSTATIN/NYSTOP Apply topically 4 (four) times daily.   omeprazole 20 MG capsule Commonly known as: PRILOSEC Take 1 capsule (20 mg total) by mouth daily.   rosuvastatin 5 MG tablet Commonly known as: CRESTOR  Take 1 tablet (5 mg total) by mouth daily with supper.       Follow-up Information    Lyndon Code, MD Follow up in 1 week(s).   Specialty: Internal Medicine Contact information: [address] [place] [address] [phone]               Signed: Marrion Coy 11/09/2020, 1:52 PM

## 2020-11-12 ENCOUNTER — Encounter: Payer: Self-pay | Admitting: *Deleted

## 2020-11-12 ENCOUNTER — Other Ambulatory Visit: Payer: Self-pay | Admitting: *Deleted

## 2020-11-12 NOTE — Patient Outreach (Signed)
[facility]) Care Management  11/12/2020  Karen Castro Feb 10, 1961 [id number]   Transition of care call/case closure   Referral received:11/09/20 Initial outreach:11/12/20 Insurance: [facility] Choice Plan    Subjective: Initial successful telephone call to patient's preferred number in order to complete transition of care assessment; 2 HIPAA identifiers verified. Explained purpose of call and completed transition of care assessment.  Karen Castro states that she is doing good, much better. She denies having fever, signs symptoms of urinary tract infection problems.  She continues tolerating diet, denies bowel or bladder problems.  Spouse  assisting with in her  recovery.   Reviewed accessing the following [place] Benefits : She discussed ongoing  health issues and says she does not need a referral to one of the [place] chronic disease management programs. Reviewed smoking cessation quit start program benefits, she plans to review employee website and is agreeable receiving education on quitting smoking. She discussed that she has remained not smoking since discharge and goal it to quit.  She does  have the hospital indemnity, provided contact number to [employer] to file a claim as needed.  She uses a Insurance risk surveyor outpatient pharmacy at [facility] .    Objective:  Karen Castro  was hospitalized at [facility] 3/10-3/11/22 Dx: Pyelonephritis Comorbidities include: Hyperlipidemia, GERD, Depression, Rheumatoid Arthritis, Smoker.  She was discharged to home on 11/09/20 without the need for home health services or DME.   Assessment:  Patient voices good understanding of all discharge instructions.  See transition of care flowsheet for assessment details.   Plan:  Reviewed hospital discharge diagnosis of Pyelonephritis   and discharge treatment plan using hospital discharge instructions, assessing medication adherence, reviewing problems  requiring provider notification, and discussing the importance of follow up with  primary care provider and/or specialists as directed. Reinforced worsening symptoms to notify MD of sooner.   Reviewed [place] healthy lifestyle program information to receive discounted premium for  2023   Step 1: Get  your annual physical  Step 2: Complete your health assessment  Step 3:Identify your current health status and complete the corresponding action step between September 01, 2020 and May 02, 2021.     No ongoing care management needs identified so will close case to [facility] Care Management services and route successful outreach letter with [facility] Care Management pamphlet and 24 Hour Nurse Line Magnet to [employer] Care Management clinical pool to be mailed to patient's home address.  Thanked patient for their services to [facility].    Egbert Garibaldi, RN, BSN  [facility] Care Management,Care Management Coordinator  [phone]- Mobile [phone]- Toll Free Main Office

## 2020-11-12 NOTE — Patient Instructions (Signed)

## 2020-11-13 LAB — CULTURE, BLOOD (ROUTINE X 2)
Culture: NO GROWTH
Culture: NO GROWTH
Special Requests: ADEQUATE
Special Requests: ADEQUATE

## 2020-11-21 ENCOUNTER — Other Ambulatory Visit: Payer: Self-pay | Admitting: Pharmacist

## 2020-11-21 MED ORDER — HUMIRA (2 PEN) 40 MG/0.4ML ~~LOC~~ AJKT: 40.0000 mg | AUTO-INJECTOR | SUBCUTANEOUS | 5 refills | Status: DC

## 2020-11-30 ENCOUNTER — Other Ambulatory Visit (HOSPITAL_COMMUNITY): Payer: Self-pay

## 2020-12-04 ENCOUNTER — Other Ambulatory Visit: Payer: Self-pay

## 2020-12-04 MED ORDER — NICOTINE 14 MG/24HR TD PT24
MEDICATED_PATCH | TRANSDERMAL | 0 refills | Status: AC
  Filled 2020-12-04: qty 28, 28d supply, fill #0

## 2020-12-05 ENCOUNTER — Other Ambulatory Visit (HOSPITAL_COMMUNITY): Payer: Self-pay

## 2020-12-06 ENCOUNTER — Other Ambulatory Visit (HOSPITAL_COMMUNITY): Payer: Self-pay

## 2020-12-07 ENCOUNTER — Other Ambulatory Visit (HOSPITAL_COMMUNITY): Payer: Self-pay

## 2020-12-11 ENCOUNTER — Other Ambulatory Visit (HOSPITAL_COMMUNITY): Payer: Self-pay

## 2020-12-11 MED FILL — Adalimumab Auto-injector Kit 40 MG/0.4ML: SUBCUTANEOUS | 28 days supply | Qty: 2 | Fill #0 | Status: CN

## 2020-12-18 ENCOUNTER — Other Ambulatory Visit: Payer: Self-pay

## 2020-12-18 ENCOUNTER — Other Ambulatory Visit (HOSPITAL_COMMUNITY): Payer: Self-pay

## 2020-12-18 MED FILL — Adalimumab Auto-injector Kit 40 MG/0.4ML: SUBCUTANEOUS | 28 days supply | Qty: 1 | Fill #0 | Status: CN

## 2020-12-18 MED FILL — Methotrexate Sodium Tab 2.5 MG (Base Equiv): ORAL | 84 days supply | Qty: 84 | Fill #0 | Status: AC

## 2020-12-25 ENCOUNTER — Other Ambulatory Visit (HOSPITAL_COMMUNITY): Payer: Self-pay

## 2020-12-25 MED FILL — Adalimumab Auto-injector Kit 40 MG/0.4ML: SUBCUTANEOUS | 28 days supply | Qty: 1 | Fill #0 | Status: CN

## 2021-01-01 ENCOUNTER — Other Ambulatory Visit (HOSPITAL_COMMUNITY): Payer: Self-pay

## 2021-01-03 ENCOUNTER — Other Ambulatory Visit: Payer: Self-pay

## 2021-01-03 ENCOUNTER — Other Ambulatory Visit: Payer: Self-pay | Admitting: Hospice and Palliative Medicine

## 2021-01-03 DIAGNOSIS — K219 Gastro-esophageal reflux disease without esophagitis: Secondary | ICD-10-CM

## 2021-01-03 MED ORDER — OMEPRAZOLE 20 MG PO CPDR
DELAYED_RELEASE_CAPSULE | Freq: Every day | ORAL | 0 refills | Status: DC
  Filled 2021-01-03: qty 90, 90d supply, fill #0

## 2021-01-04 ENCOUNTER — Other Ambulatory Visit (HOSPITAL_COMMUNITY): Payer: Self-pay

## 2021-01-08 ENCOUNTER — Other Ambulatory Visit: Payer: Self-pay

## 2021-01-08 MED FILL — Desvenlafaxine Succinate Tab ER 24HR 50 MG (Base Equiv): ORAL | 90 days supply | Qty: 270 | Fill #0 | Status: AC

## 2021-01-09 ENCOUNTER — Other Ambulatory Visit (HOSPITAL_COMMUNITY): Payer: Self-pay

## 2021-01-09 ENCOUNTER — Other Ambulatory Visit: Payer: Self-pay | Admitting: Pharmacist

## 2021-01-09 ENCOUNTER — Other Ambulatory Visit: Payer: Self-pay

## 2021-01-09 MED ORDER — HUMIRA PEN 40 MG/0.8ML ~~LOC~~ PNKT
PEN_INJECTOR | SUBCUTANEOUS | 3 refills | Status: AC
  Filled 2021-01-09: qty 6, fill #0
  Filled 2021-01-10 – 2021-01-11 (×2): qty 2, 28d supply, fill #0
  Filled 2021-02-04: qty 2, 28d supply, fill #1

## 2021-01-09 MED ORDER — HUMIRA PEN 40 MG/0.8ML ~~LOC~~ PNKT: PEN_INJECTOR | SUBCUTANEOUS | 3 refills | Status: DC

## 2021-01-10 ENCOUNTER — Other Ambulatory Visit (HOSPITAL_COMMUNITY): Payer: Self-pay

## 2021-01-11 ENCOUNTER — Other Ambulatory Visit (HOSPITAL_COMMUNITY): Payer: Self-pay

## 2021-02-04 ENCOUNTER — Other Ambulatory Visit (HOSPITAL_COMMUNITY): Payer: Self-pay

## 2021-02-05 ENCOUNTER — Other Ambulatory Visit (HOSPITAL_COMMUNITY): Payer: Self-pay

## 2021-02-07 ENCOUNTER — Ambulatory Visit: Payer: Self-pay | Admitting: Physician Assistant

## 2021-02-07 ENCOUNTER — Other Ambulatory Visit: Payer: Self-pay

## 2021-02-07 ENCOUNTER — Other Ambulatory Visit: Payer: Self-pay | Admitting: Internal Medicine

## 2021-02-07 DIAGNOSIS — E782 Mixed hyperlipidemia: Secondary | ICD-10-CM

## 2021-02-07 MED ORDER — ROSUVASTATIN CALCIUM 5 MG PO TABS
ORAL_TABLET | Freq: Every day | ORAL | 1 refills | Status: DC
  Filled 2021-02-07: qty 90, 90d supply, fill #0
  Filled 2021-05-13: qty 90, 90d supply, fill #1

## 2021-02-07 MED FILL — Folic Acid Tab 1 MG: ORAL | 90 days supply | Qty: 90 | Fill #0 | Status: AC

## 2021-02-08 ENCOUNTER — Other Ambulatory Visit: Payer: Self-pay

## 2021-02-08 ENCOUNTER — Ambulatory Visit: Payer: 59 | Admitting: Physician Assistant

## 2021-02-08 ENCOUNTER — Encounter: Payer: Self-pay | Admitting: Physician Assistant

## 2021-02-08 DIAGNOSIS — E782 Mixed hyperlipidemia: Secondary | ICD-10-CM

## 2021-02-08 DIAGNOSIS — I700 Atherosclerosis of aorta: Secondary | ICD-10-CM | POA: Diagnosis not present

## 2021-02-08 DIAGNOSIS — I714 Abdominal aortic aneurysm, without rupture, unspecified: Secondary | ICD-10-CM

## 2021-02-08 DIAGNOSIS — Z789 Other specified health status: Secondary | ICD-10-CM

## 2021-02-08 DIAGNOSIS — F3341 Major depressive disorder, recurrent, in partial remission: Secondary | ICD-10-CM

## 2021-02-08 DIAGNOSIS — J4520 Mild intermittent asthma, uncomplicated: Secondary | ICD-10-CM | POA: Diagnosis not present

## 2021-02-08 DIAGNOSIS — M069 Rheumatoid arthritis, unspecified: Secondary | ICD-10-CM | POA: Diagnosis not present

## 2021-02-08 MED ORDER — DESVENLAFAXINE SUCCINATE ER 50 MG PO TB24
ORAL_TABLET | Freq: Every day | ORAL | 1 refills | Status: DC
  Filled 2021-02-08 – 2021-04-08 (×2): qty 270, 90d supply, fill #0
  Filled 2021-07-16: qty 270, 90d supply, fill #1

## 2021-02-08 MED ORDER — ALBUTEROL SULFATE HFA 108 (90 BASE) MCG/ACT IN AERS
INHALATION_SPRAY | RESPIRATORY_TRACT | 3 refills | Status: AC
Start: 2021-02-08 — End: 2022-02-08
  Filled 2021-02-08: qty 18, 30d supply, fill #0
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
## 2021-02-11 ENCOUNTER — Other Ambulatory Visit: Payer: Self-pay

## 2021-02-11 ENCOUNTER — Other Ambulatory Visit (HOSPITAL_BASED_OUTPATIENT_CLINIC_OR_DEPARTMENT_OTHER): Payer: Self-pay

## 2021-02-25 ENCOUNTER — Other Ambulatory Visit (HOSPITAL_COMMUNITY): Payer: Self-pay

## 2021-03-05 ENCOUNTER — Other Ambulatory Visit (HOSPITAL_COMMUNITY): Payer: Self-pay

## 2021-03-20 DIAGNOSIS — Z79899 Other long term (current) drug therapy: Secondary | ICD-10-CM | POA: Diagnosis not present

## 2021-03-20 DIAGNOSIS — M0579 Rheumatoid arthritis with rheumatoid factor of multiple sites without organ or systems involvement: Secondary | ICD-10-CM | POA: Diagnosis not present

## 2021-03-21 ENCOUNTER — Other Ambulatory Visit: Payer: Self-pay

## 2021-03-21 ENCOUNTER — Other Ambulatory Visit (HOSPITAL_COMMUNITY): Payer: Self-pay

## 2021-03-25 ENCOUNTER — Other Ambulatory Visit: Payer: Self-pay

## 2021-03-25 ENCOUNTER — Telehealth: Payer: Self-pay | Admitting: Pharmacist

## 2021-03-25 DIAGNOSIS — M0579 Rheumatoid arthritis with rheumatoid factor of multiple sites without organ or systems involvement: Secondary | ICD-10-CM | POA: Diagnosis not present

## 2021-03-25 DIAGNOSIS — D72829 Elevated white blood cell count, unspecified: Secondary | ICD-10-CM | POA: Diagnosis not present

## 2021-03-25 DIAGNOSIS — G250 Essential tremor: Secondary | ICD-10-CM | POA: Diagnosis not present

## 2021-03-25 DIAGNOSIS — Z79899 Other long term (current) drug therapy: Secondary | ICD-10-CM | POA: Diagnosis not present

## 2021-03-25 NOTE — Telephone Encounter (Signed)
Called patient to schedule an appointment for the [facility] [employer] Special[facility]ication [facility]. I was unable to reach the patient and her VM was full. Will attempt to reach her at a later time.   Butch Penny, PharmD, Patsy [place], CPP Clinical Pharmacist [facility] [phone]

## 2021-03-27 ENCOUNTER — Other Ambulatory Visit (HOSPITAL_COMMUNITY): Payer: Self-pay

## 2021-03-27 ENCOUNTER — Other Ambulatory Visit: Payer: Self-pay

## 2021-03-27 MED ORDER — METHOTREXATE SODIUM 2.5 MG PO TABS
ORAL_TABLET | ORAL | 1 refills | Status: DC
  Filled 2021-03-27: qty 84, 84d supply, fill #0
  Filled 2021-06-20: qty 84, 84d supply, fill #1

## 2021-04-04 ENCOUNTER — Other Ambulatory Visit: Payer: Self-pay | Admitting: Internal Medicine

## 2021-04-04 ENCOUNTER — Other Ambulatory Visit: Payer: Self-pay

## 2021-04-04 DIAGNOSIS — E782 Mixed hyperlipidemia: Secondary | ICD-10-CM | POA: Diagnosis not present

## 2021-04-04 DIAGNOSIS — M0579 Rheumatoid arthritis with rheumatoid factor of multiple sites without organ or systems involvement: Secondary | ICD-10-CM | POA: Diagnosis not present

## 2021-04-04 DIAGNOSIS — K219 Gastro-esophageal reflux disease without esophagitis: Secondary | ICD-10-CM | POA: Diagnosis not present

## 2021-04-04 DIAGNOSIS — F3341 Major depressive disorder, recurrent, in partial remission: Secondary | ICD-10-CM | POA: Diagnosis not present

## 2021-04-04 DIAGNOSIS — Z1231 Encounter for screening mammogram for malignant neoplasm of breast: Secondary | ICD-10-CM

## 2021-04-04 MED ORDER — OMEPRAZOLE 20 MG PO CPDR
DELAYED_RELEASE_CAPSULE | ORAL | 1 refills | Status: DC
  Filled 2021-04-04: qty 90, 90d supply, fill #0
  Filled 2021-07-08: qty 90, 90d supply, fill #1

## 2021-04-08 ENCOUNTER — Other Ambulatory Visit: Payer: Self-pay

## 2021-04-09 ENCOUNTER — Other Ambulatory Visit: Payer: Self-pay

## 2021-04-10 ENCOUNTER — Other Ambulatory Visit: Payer: Self-pay

## 2021-05-13 ENCOUNTER — Other Ambulatory Visit: Payer: Self-pay

## 2021-05-13 MED FILL — Folic Acid Tab 1 MG: ORAL | 90 days supply | Qty: 90 | Fill #1 | Status: AC

## 2021-06-13 ENCOUNTER — Encounter: Payer: 59 | Admitting: Physician Assistant

## 2021-06-20 ENCOUNTER — Encounter: Payer: 59 | Admitting: Physician Assistant

## 2021-06-20 ENCOUNTER — Other Ambulatory Visit: Payer: Self-pay

## 2021-06-25 DIAGNOSIS — E782 Mixed hyperlipidemia: Secondary | ICD-10-CM | POA: Diagnosis not present

## 2021-06-25 DIAGNOSIS — Z780 Asymptomatic menopausal state: Secondary | ICD-10-CM | POA: Diagnosis not present

## 2021-06-25 DIAGNOSIS — R7309 Other abnormal glucose: Secondary | ICD-10-CM | POA: Diagnosis not present

## 2021-06-25 DIAGNOSIS — Z1159 Encounter for screening for other viral diseases: Secondary | ICD-10-CM | POA: Diagnosis not present

## 2021-06-25 DIAGNOSIS — M0579 Rheumatoid arthritis with rheumatoid factor of multiple sites without organ or systems involvement: Secondary | ICD-10-CM | POA: Diagnosis not present

## 2021-06-25 DIAGNOSIS — Z79899 Other long term (current) drug therapy: Secondary | ICD-10-CM | POA: Diagnosis not present

## 2021-06-25 DIAGNOSIS — Z01818 Encounter for other preprocedural examination: Secondary | ICD-10-CM | POA: Diagnosis not present

## 2021-06-25 DIAGNOSIS — Z1211 Encounter for screening for malignant neoplasm of colon: Secondary | ICD-10-CM | POA: Diagnosis not present

## 2021-07-08 ENCOUNTER — Other Ambulatory Visit: Payer: Self-pay

## 2021-07-17 ENCOUNTER — Other Ambulatory Visit: Payer: Self-pay

## 2021-08-28 ENCOUNTER — Other Ambulatory Visit: Payer: Self-pay

## 2021-08-28 MED FILL — Folic Acid Tab 1 MG: ORAL | 90 days supply | Qty: 90 | Fill #2 | Status: AC

## 2021-09-06 ENCOUNTER — Other Ambulatory Visit: Payer: Self-pay

## 2021-09-06 MED ORDER — ROSUVASTATIN CALCIUM 5 MG PO TABS
ORAL_TABLET | Freq: Every day | ORAL | 1 refills | Status: DC
  Filled 2021-09-06: qty 90, 90d supply, fill #0
  Filled 2022-01-07: qty 90, 90d supply, fill #1

## 2021-09-23 ENCOUNTER — Other Ambulatory Visit: Payer: Self-pay

## 2021-09-23 MED ORDER — METHOTREXATE SODIUM 2.5 MG PO TABS
ORAL_TABLET | ORAL | 1 refills | Status: DC
  Filled 2021-09-23: qty 84, 84d supply, fill #0
  Filled 2021-12-16: qty 84, 84d supply, fill #1

## 2021-09-25 DIAGNOSIS — Z79899 Other long term (current) drug therapy: Secondary | ICD-10-CM | POA: Diagnosis not present

## 2021-09-25 DIAGNOSIS — M0579 Rheumatoid arthritis with rheumatoid factor of multiple sites without organ or systems involvement: Secondary | ICD-10-CM | POA: Diagnosis not present

## 2021-09-30 DIAGNOSIS — G250 Essential tremor: Secondary | ICD-10-CM | POA: Diagnosis not present

## 2021-09-30 DIAGNOSIS — M0579 Rheumatoid arthritis with rheumatoid factor of multiple sites without organ or systems involvement: Secondary | ICD-10-CM | POA: Diagnosis not present

## 2021-09-30 DIAGNOSIS — Z79899 Other long term (current) drug therapy: Secondary | ICD-10-CM | POA: Diagnosis not present

## 2021-10-03 ENCOUNTER — Other Ambulatory Visit: Payer: Self-pay

## 2021-10-03 MED ORDER — VALACYCLOVIR HCL 1 G PO TABS
2000.0000 mg | ORAL_TABLET | Freq: Two times a day (BID) | ORAL | 1 refills | Status: AC
Start: 2021-10-03 — End: ?
  Filled 2021-10-03: qty 8, 2d supply, fill #0
  Filled 2021-10-09: qty 8, 2d supply, fill #1

## 2021-10-09 ENCOUNTER — Other Ambulatory Visit: Payer: Self-pay

## 2021-10-09 DIAGNOSIS — F3341 Major depressive disorder, recurrent, in partial remission: Secondary | ICD-10-CM | POA: Diagnosis not present

## 2021-10-09 DIAGNOSIS — E782 Mixed hyperlipidemia: Secondary | ICD-10-CM | POA: Diagnosis not present

## 2021-10-09 DIAGNOSIS — M0579 Rheumatoid arthritis with rheumatoid factor of multiple sites without organ or systems involvement: Secondary | ICD-10-CM | POA: Diagnosis not present

## 2021-10-09 DIAGNOSIS — I7143 Infrarenal abdominal aortic aneurysm, without rupture: Secondary | ICD-10-CM | POA: Diagnosis not present

## 2021-10-09 MED ORDER — VALACYCLOVIR HCL 1 G PO TABS
ORAL_TABLET | ORAL | 1 refills | Status: AC
  Filled 2021-10-09: qty 10, 2d supply, fill #0

## 2021-10-22 ENCOUNTER — Other Ambulatory Visit: Payer: Self-pay

## 2021-10-23 ENCOUNTER — Other Ambulatory Visit: Payer: Self-pay

## 2021-10-23 MED ORDER — OMEPRAZOLE 20 MG PO CPDR
DELAYED_RELEASE_CAPSULE | ORAL | 1 refills | Status: DC
  Filled 2021-10-23: qty 90, 90d supply, fill #0
  Filled 2022-01-30: qty 90, 90d supply, fill #1

## 2021-10-26 IMAGING — CR DG CHEST 2V
1 series · 2 of 2 positions shown · non-contrast
Comparison: Chest x-ray 06/11/2011.

CLINICAL DATA: Persistent cough.  Prior positive COVID test.

EXAM:
CHEST - 2 VIEW

[Series 1: dg chest 2 view · 0.14mm/px · 2 of 2 slices shown]
[im 1/2]
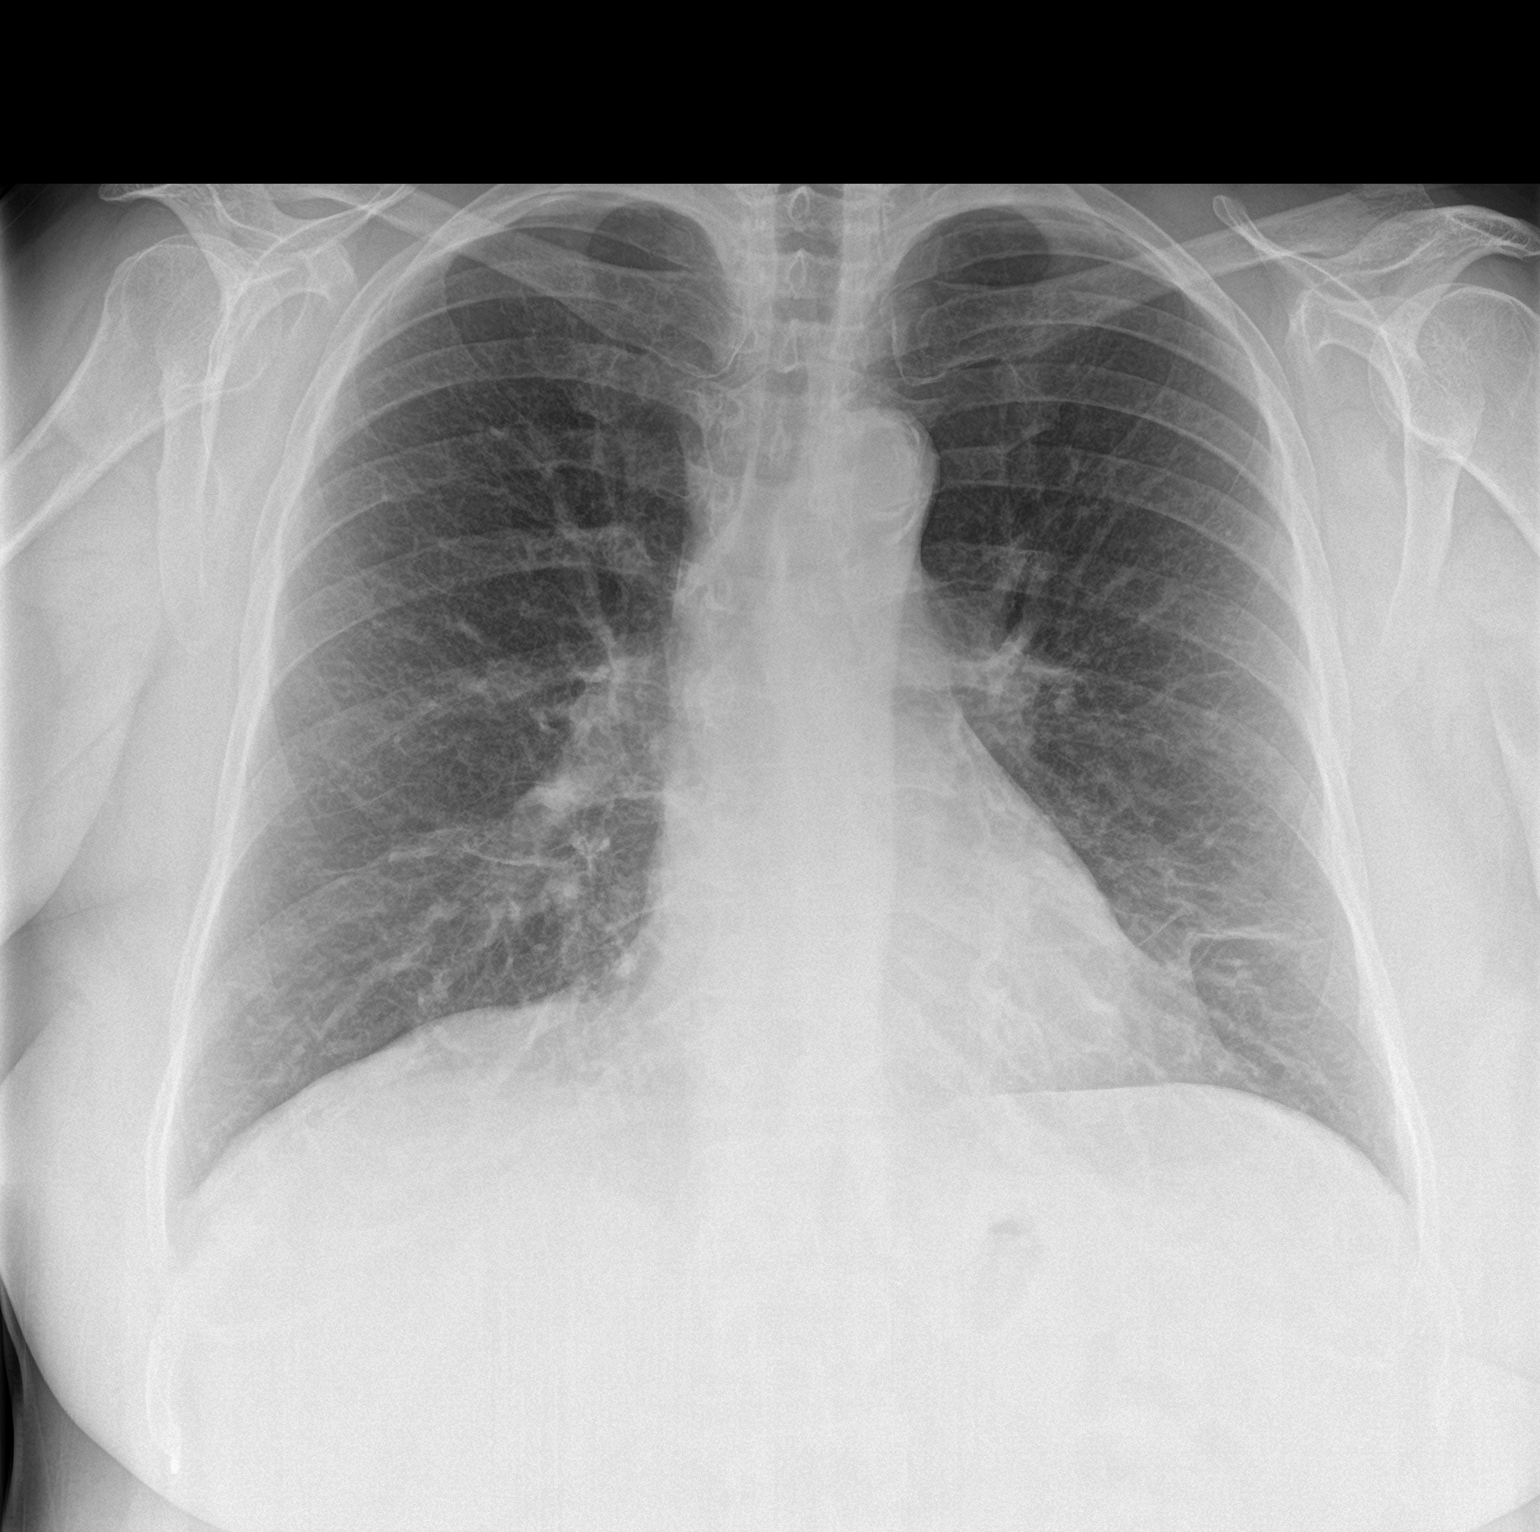
[im 2/2]
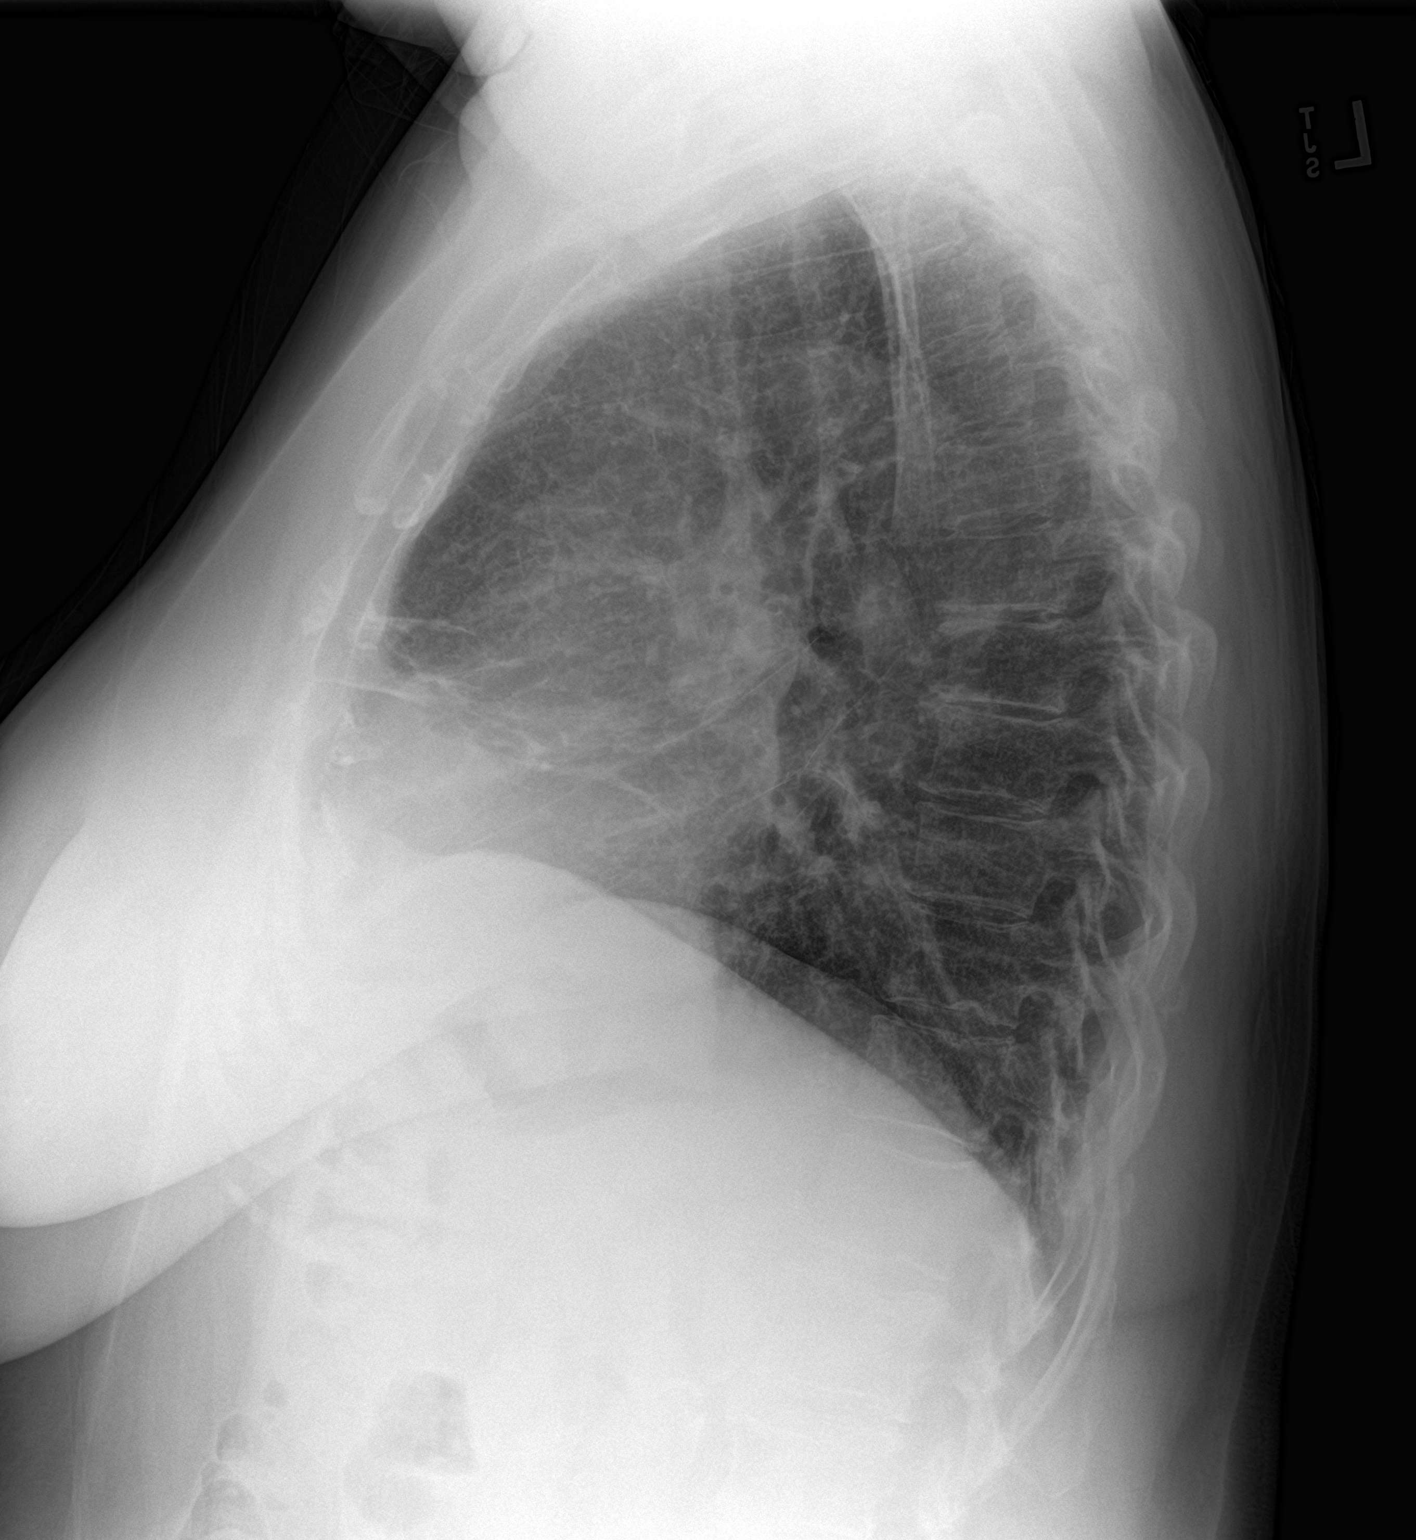

[2 of 2 positions shown; findings below may reference images not displayed]

FINDINGS: Mediastinum hilar structures normal. Heart size normal. Very mild
infiltrate in the right mid lung cannot be excluded. Stable
bibasilar subsegmental atelectasis/scarring. No pleural effusion or
pneumothorax. Diffuse osteopenia and degenerative change thoracic
spine.
IMPRESSION: 1.  Mild infiltrate right mid lung cannot be excluded.

2.  Stable bibasilar subsegmental atelectasis/scarring.

## 2021-11-02 ENCOUNTER — Other Ambulatory Visit: Payer: Self-pay

## 2021-11-02 ENCOUNTER — Encounter: Payer: Self-pay | Admitting: Emergency Medicine

## 2021-11-02 ENCOUNTER — Ambulatory Visit
Admission: EM | Admit: 2021-11-02 | Discharge: 2021-11-02 | Disposition: A | Discharge status: Home / Self Care | Payer: 59 | Attending: Family Medicine | Admitting: Family Medicine

## 2021-11-02 DIAGNOSIS — J209 Acute bronchitis, unspecified: Secondary | ICD-10-CM

## 2021-11-02 DIAGNOSIS — J019 Acute sinusitis, unspecified: Secondary | ICD-10-CM | POA: Diagnosis not present

## 2021-11-02 MED ORDER — PROMETHAZINE-DM 6.25-15 MG/5ML PO SYRP: 5.0000 mL | ORAL_SOLUTION | Freq: Three times a day (TID) | ORAL | 0 refills | Status: AC | PRN

## 2021-11-02 MED ORDER — PREDNISONE 20 MG PO TABS: 20.0000 mg | ORAL_TABLET | Freq: Every day | ORAL | 0 refills | Status: AC

## 2021-11-02 MED ORDER — AMOXICILLIN-POT CLAVULANATE 875-125 MG PO TABS: 1.0000 | ORAL_TABLET | Freq: Two times a day (BID) | ORAL | 0 refills | Status: AC

## 2021-11-02 NOTE — ED Provider Notes (Signed)
?[facility] ? ? ? ?CSN: [id number] ?Arrival date & time: 11/02/21  0802 ? ? ?  ? ?History   ?Chief Complaint ?Chief Complaint  ?Patient presents with  ? Cough  ? Nasal Congestion  ? ? ?HPI ?Karen Castro is a 61 y.o. female.  ? ?HPI ?Patient presents today with a one week history of cough, nasal congestion, sore throat (left side primarily). No known exposure to COVID/FLU/Strep. Associated symptoms include wheezing and chest tightness. She has taken Mucinex other URI symptoms and reports use of albuterol inhaler increased since symptoms started. No fever. Patient is smoker with history of prior bronchitis flares. ? ?Past Medical History:  ?Diagnosis Date  ? Arthritis   ? Hypercholesteremia   ? ? ?Patient Active Problem List  ? Diagnosis Date Noted  ? Acute pyelonephritis 11/08/2020  ? Smoking 11/08/2020  ? Cough 11/08/2020  ? Encounter for general adult medical examination with abnormal findings 03/05/2020  ? Vitamin D deficiency 11/22/2019  ? Mild intermittent asthma without complication 07/25/2019  ? Cellulitis and abscess of foot 03/02/2019  ? Atopic dermatitis 03/02/2019  ? Nausea and vomiting 01/21/2019  ? Excessive daytime sleepiness 07/20/2018  ? Moderate recurrent major depression (HCC) 07/20/2018  ? Gastro-esophageal reflux disease without esophagitis 07/20/2018  ? Screening for breast cancer 01/14/2018  ? Acute upper respiratory infection 01/14/2018  ? Mixed hyperlipidemia 01/14/2018  ? Recurrent major depressive disorder, in partial remission (HCC) 01/14/2018  ? Rheumatoid arthritis (HCC) 01/14/2018  ? Dysuria 01/14/2018  ? ? ?Past Surgical History:  ?Procedure Laterality Date  ? ABDOMINAL HYSTERECTOMY    ? BREAST BIOPSY Right   ? core- neg  ? ? ?OB History   ?No obstetric history on file. ?  ? ? ? ?Home Medications   ? ?Prior to Admission medications   ?Medication Sig Start Date End Date Taking? Authorizing Provider  ?amoxicillin-clavulanate (AUGMENTIN) 875-125 MG tablet Take 1 tablet by  mouth 2 (two) times daily. 11/02/21  Yes Bing Neighbors, FNP  ?predniSONE (DELTASONE) 20 MG tablet Take 1 tablet (20 mg total) by mouth daily with breakfast for 5 days. 11/02/21 11/07/21 Yes Bing Neighbors, FNP  ?promethazine-dextromethorphan (PROMETHAZINE-DM) 6.25-15 MG/5ML syrup Take 5 mLs by mouth 3 (three) times daily as needed for cough. 11/02/21  Yes Bing Neighbors, FNP  ?Adalimumab (HUMIRA PEN) 40 MG/0.8ML PNKT Inject 0.8 mLs (40 mg total) subcutaneously every 14 (fourteen) days for 30 days 01/09/21   Quentin Angst, MD  ?albuterol (VENTOLIN HFA) 108 (90 Base) MCG/ACT inhaler INHALE 2 PUFFS BY MOUTH EVERY 6 HOURS AS NEEDED FOR WHEEZING OR SHORTNESS OF BREATH. 02/08/21 02/08/22  McDonough, Salomon Fick, PA-C  ?desvenlafaxine (PRISTIQ) 50 MG 24 hr tablet TAKE 3 TABLETS BY MOUTH DAILY ?Patient taking differently: Take 150 mg by mouth daily. Take 3 tablets by mouth daily 10/18/20 10/18/21  Theotis Burrow, NP  ?desvenlafaxine (PRISTIQ) 50 MG 24 hr tablet TAKE 3 TABLETS (150 MG) BY MOUTH DAILY. 04/19/20 04/19/21  Carlean Jews, NP  ?desvenlafaxine (PRISTIQ) 50 MG 24 hr tablet TAKE 3 TABLETS BY MOUTH DAILY 02/08/21 02/08/22  McDonough, Salomon Fick, PA-C  ?fexofenadine (ALLEGRA) 180 MG tablet Take 180 mg by mouth daily.    [provider]  ?folic acid (FOLVITE) 1 MG tablet Take 1 mg by mouth daily.  01/19/17   [provider]  ?folic acid (FOLVITE) 1 MG tablet TAKE 1 TABLET BY MOUTH ONCE A DAY 11/06/20 11/28/21  Kandyce Rud., MD  ?methotrexate [facility])  2.5 MG tablet Take 17.5 mg by mouth once a week. Wednesday 12/07/17   [provider]  ?methotrexate 2.5 MG tablet TAKE 7 TABLETS (17.5 MG TOTAL) BY MOUTH EVERY 7 (SEVEN) DAYS 09/23/21 09/23/22    ?nicotine (NICODERM CQ - DOSED IN MG/24 HOURS) 14 mg/24hr patch Place 1 patch onto the skin daily 12/04/20   Nicks, Estelle GrumblesKristie J, RPH  ?nicotine (NICODERM CQ - DOSED IN MG/24 HOURS) 21 mg/24hr patch PLACE 1 PATCH (21 MG TOTAL) ONTO THE SKIN DAILY.  11/09/20 11/09/21  Marrion CoyZhang, Dekui, MD  ?nystatin (MYCOSTATIN/NYSTOP) powder Apply topically 4 (four) times daily. 08/17/19   Carlean JewsBoscia, Heather E, NP  ?omeprazole (PRILOSEC) 20 MG capsule Take 1 capsule (20 mg total) by mouth once daily 10/23/21     ?rosuvastatin (CRESTOR) 5 MG tablet TAKE 1 TABLET BY MOUTH DAILY WITH SUPPER 09/06/21 09/06/22    ?valACYclovir (VALTREX) 1000 MG tablet Take 2 tablets (2,000 mg total) by mouth every 12 (twelve) hours. 10/03/21   Arnaldo NatalMalinda, Paul F, MD  ?valACYclovir (VALTREX) 1000 MG tablet Take 2 tablets (2,000 mg total) by mouth 2 (two) times daily for 2 days 10/09/21     ? ? ?Family History ?Family History  ?Problem Relation Age of Onset  ? Breast cancer Maternal Grandmother [id number]  ? ? ?Social History ?Social History  ? ?Tobacco Use  ? Smoking status: Former  ?  Packs/day: 1.00  ?  Types: Cigarettes  ?  Quit date: 12/20/2020  ?  Years since quitting: 0.8  ? Smokeless tobacco: Never  ? Tobacco comments:  ?  HAS TRIED TO QUIT   ?Vaping Use  ? Vaping Use: Every day  ?Substance Use Topics  ? Alcohol use: Yes  ?  Comment: socially  ? Drug use: No  ? ? ? ?Allergies   ?Patient has no known allergies. ? ? ?Review of Systems ?Review of Systems ?Pertinent negatives listed in HPI  ? ?Physical Exam ?Triage Vital Signs ?ED Triage Vitals  ?Enc Vitals Group  ?   BP   ?   Pulse   ?   Resp   ?   Temp   ?   Temp src   ?   SpO2   ?   Weight   ?   Height   ?   Head Circumference   ?   Peak Flow   ?   Pain Score   ?   Pain Loc   ?   Pain Edu?   ?   Excl. in GC?   ? ?No data found. ? ?Updated Vital Signs ?BP 126/75 (BP Location: Left Arm)   Pulse 76   Temp 98.1 ?F (36.7 ?C) (Oral)   Resp 18   SpO2 96%  ? ?Visual Acuity ?Right Eye Distance:   ?Left Eye Distance:   ?Bilateral Distance:   ? ?Right Eye Near:   ?Left Eye Near:    ?Bilateral Near:    ? ?Physical Exam ? ?General Appearance:    Alert, cooperative, no distress  ?HENT:   Normocephalic, ears normal, nares mucosal edema with congestion, rhinorrhea, oropharynx  w/o  exudate or erythema  ?Eyes:    PERRL, conjunctiva/corneas clear, EOM's intact       ?Lungs:     Expiratory wheeze present on  auscultation bilaterally, respirations unlabored  ?Heart:    Regular rate and rhythm  ?Neurologic:   Awake, alert, oriented x 3. No apparent focal neurological           defect.   ?  ? ? ?  UC Treatments / Results  ?Labs ?(all labs ordered are listed, but only abnormal results are displayed) ?Labs Reviewed - No data to display ? ?EKG ? ? ?Radiology ?No results found. ? ?Procedures ?Procedures (including critical care time) ? ?Medications Ordered in UC ?Medications - No data to display ? ?Initial Impression / Assessment and Plan / UC Course  ?I have reviewed the triage vital signs and the nursing notes. ? ?Pertinent labs & imaging results that were available during my care of the patient were reviewed by me and considered in my medical decision making (see chart for details). ? ?  ?Acute bronchitis, continue albuterol and prednisone  ?20 mg daily x  5 days and promethazine for cough. ?Acute sinusitis, tx with augmentin x 10 days ?RTC PRN  ?Final Clinical Impressions(s) / UC Diagnoses  ? ?Final diagnoses:  ?Acute non-recurrent sinusitis, unspecified location  ?Acute bronchitis with wheezing  ? ?Discharge Instructions   ?None ?  ? ?ED Prescriptions   ? ? Medication Sig Dispense Auth. Provider  ? predniSONE (DELTASONE) 20 MG tablet Take 1 tablet (20 mg total) by mouth daily with breakfast for 5 days. 5 tablet Bing Neighbors, FNP  ? amoxicillin-clavulanate (AUGMENTIN) 875-125 MG tablet Take 1 tablet by mouth 2 (two) times daily. 20 tablet Bing Neighbors, FNP  ? promethazine-dextromethorphan (PROMETHAZINE-DM) 6.25-15 MG/5ML syrup Take 5 mLs by mouth 3 (three) times daily as needed for cough. 140 mL Bing Neighbors, FNP  ? ?  ? ?PDMP not reviewed this encounter. ?  ?Bing Neighbors, FNP ?11/02/21 [phone] ? ?

## 2021-11-02 NOTE — ED Triage Notes (Signed)
Pt c/o cough, nasal congestion, ST on the left side x 1 week.  ?

## 2021-11-04 ENCOUNTER — Other Ambulatory Visit: Payer: Self-pay | Admitting: Physician Assistant

## 2021-11-04 ENCOUNTER — Other Ambulatory Visit: Payer: Self-pay

## 2021-11-04 DIAGNOSIS — F3341 Major depressive disorder, recurrent, in partial remission: Secondary | ICD-10-CM

## 2021-11-04 MED ORDER — DESVENLAFAXINE SUCCINATE ER 50 MG PO TB24
ORAL_TABLET | Freq: Every day | ORAL | 0 refills | Status: AC
  Filled 2021-11-04: qty 90, 30d supply, fill #0

## 2021-11-06 ENCOUNTER — Other Ambulatory Visit: Payer: Self-pay

## 2021-12-10 ENCOUNTER — Other Ambulatory Visit: Payer: Self-pay | Admitting: Physician Assistant

## 2021-12-10 ENCOUNTER — Other Ambulatory Visit: Payer: Self-pay

## 2021-12-10 DIAGNOSIS — F3341 Major depressive disorder, recurrent, in partial remission: Secondary | ICD-10-CM

## 2021-12-10 MED ORDER — FOLIC ACID 1 MG PO TABS
1.0000 mg | ORAL_TABLET | Freq: Every day | ORAL | 3 refills | Status: AC
  Filled 2021-12-10: qty 90, 90d supply, fill #0
  Filled 2022-03-31: qty 90, 90d supply, fill #1
  Filled 2022-07-21: qty 90, 90d supply, fill #2
  Filled 2022-11-10: qty 90, 90d supply, fill #3

## 2021-12-16 ENCOUNTER — Other Ambulatory Visit: Payer: Self-pay

## 2021-12-17 ENCOUNTER — Other Ambulatory Visit: Payer: Self-pay

## 2021-12-18 ENCOUNTER — Other Ambulatory Visit: Payer: Self-pay

## 2021-12-18 MED ORDER — DESVENLAFAXINE SUCCINATE ER 50 MG PO TB24
ORAL_TABLET | ORAL | 0 refills | Status: DC
  Filled 2021-12-18: qty 90, 30d supply, fill #0

## 2022-01-07 ENCOUNTER — Other Ambulatory Visit: Payer: Self-pay

## 2022-01-16 ENCOUNTER — Other Ambulatory Visit: Payer: Self-pay

## 2022-01-17 ENCOUNTER — Other Ambulatory Visit: Payer: Self-pay

## 2022-01-17 MED ORDER — DESVENLAFAXINE SUCCINATE ER 50 MG PO TB24
ORAL_TABLET | ORAL | 0 refills | Status: DC
  Filled 2022-01-17: qty 90, 30d supply, fill #0

## 2022-01-30 ENCOUNTER — Other Ambulatory Visit: Payer: Self-pay

## 2022-02-18 ENCOUNTER — Other Ambulatory Visit: Payer: Self-pay

## 2022-02-18 MED ORDER — NYSTATIN 100000 UNIT/GM EX POWD
Freq: Two times a day (BID) | CUTANEOUS | 0 refills | Status: AC
  Filled 2022-02-18: qty 30, 15d supply, fill #0

## 2022-02-19 ENCOUNTER — Other Ambulatory Visit: Payer: Self-pay

## 2022-02-26 ENCOUNTER — Other Ambulatory Visit: Payer: Self-pay

## 2022-02-26 MED ORDER — DESVENLAFAXINE SUCCINATE ER 50 MG PO TB24
ORAL_TABLET | ORAL | 1 refills | Status: DC
  Filled 2022-02-26: qty 90, 30d supply, fill #0
  Filled 2022-03-31: qty 90, 30d supply, fill #1

## 2022-03-14 ENCOUNTER — Other Ambulatory Visit: Payer: Self-pay

## 2022-03-14 MED ORDER — METHOTREXATE SODIUM 2.5 MG PO TABS
ORAL_TABLET | ORAL | 1 refills | Status: AC
Start: 2022-03-14 — End: ?
  Filled 2022-03-14: qty 84, 84d supply, fill #0
  Filled 2022-06-02: qty 84, 84d supply, fill #1

## 2022-03-17 ENCOUNTER — Other Ambulatory Visit: Payer: Self-pay

## 2022-03-17 DIAGNOSIS — E782 Mixed hyperlipidemia: Secondary | ICD-10-CM | POA: Diagnosis not present

## 2022-03-17 DIAGNOSIS — M0579 Rheumatoid arthritis with rheumatoid factor of multiple sites without organ or systems involvement: Secondary | ICD-10-CM | POA: Diagnosis not present

## 2022-03-17 DIAGNOSIS — R7303 Prediabetes: Secondary | ICD-10-CM | POA: Diagnosis not present

## 2022-03-17 DIAGNOSIS — Z79899 Other long term (current) drug therapy: Secondary | ICD-10-CM | POA: Diagnosis not present

## 2022-03-18 ENCOUNTER — Other Ambulatory Visit: Payer: Self-pay

## 2022-03-31 ENCOUNTER — Other Ambulatory Visit: Payer: Self-pay

## 2022-04-07 ENCOUNTER — Other Ambulatory Visit: Payer: Self-pay

## 2022-04-07 DIAGNOSIS — J019 Acute sinusitis, unspecified: Secondary | ICD-10-CM | POA: Diagnosis not present

## 2022-04-07 DIAGNOSIS — E782 Mixed hyperlipidemia: Secondary | ICD-10-CM | POA: Diagnosis not present

## 2022-04-07 DIAGNOSIS — F3341 Major depressive disorder, recurrent, in partial remission: Secondary | ICD-10-CM | POA: Diagnosis not present

## 2022-04-07 DIAGNOSIS — Z0000 Encounter for general adult medical examination without abnormal findings: Secondary | ICD-10-CM | POA: Diagnosis not present

## 2022-04-07 DIAGNOSIS — I7143 Infrarenal abdominal aortic aneurysm, without rupture: Secondary | ICD-10-CM | POA: Diagnosis not present

## 2022-04-07 DIAGNOSIS — K219 Gastro-esophageal reflux disease without esophagitis: Secondary | ICD-10-CM | POA: Diagnosis not present

## 2022-04-07 DIAGNOSIS — Z1211 Encounter for screening for malignant neoplasm of colon: Secondary | ICD-10-CM | POA: Diagnosis not present

## 2022-04-07 DIAGNOSIS — B9689 Other specified bacterial agents as the cause of diseases classified elsewhere: Secondary | ICD-10-CM | POA: Diagnosis not present

## 2022-04-07 DIAGNOSIS — M0579 Rheumatoid arthritis with rheumatoid factor of multiple sites without organ or systems involvement: Secondary | ICD-10-CM | POA: Diagnosis not present

## 2022-04-07 MED ORDER — PAXLOVID (300/100) 20 X 150 MG & 10 X 100MG PO TBPK
ORAL_TABLET | ORAL | 0 refills | Status: AC
  Filled 2022-04-07: qty 30, 5d supply, fill #0

## 2022-04-07 MED ORDER — AZITHROMYCIN 250 MG PO TABS
ORAL_TABLET | ORAL | 0 refills | Status: AC
  Filled 2022-04-07: qty 6, 5d supply, fill #0

## 2022-04-07 MED ORDER — PREDNISONE 20 MG PO TABS
ORAL_TABLET | ORAL | 0 refills | Status: AC
  Filled 2022-04-07: qty 12, 8d supply, fill #0

## 2022-04-08 ENCOUNTER — Other Ambulatory Visit: Payer: Self-pay

## 2022-04-11 ENCOUNTER — Other Ambulatory Visit: Payer: Self-pay | Admitting: Internal Medicine

## 2022-04-11 DIAGNOSIS — Z1231 Encounter for screening mammogram for malignant neoplasm of breast: Secondary | ICD-10-CM

## 2022-04-18 DIAGNOSIS — M19041 Primary osteoarthritis, right hand: Secondary | ICD-10-CM | POA: Diagnosis not present

## 2022-04-18 DIAGNOSIS — Z79899 Other long term (current) drug therapy: Secondary | ICD-10-CM | POA: Diagnosis not present

## 2022-04-18 DIAGNOSIS — M06041 Rheumatoid arthritis without rheumatoid factor, right hand: Secondary | ICD-10-CM | POA: Diagnosis not present

## 2022-04-18 DIAGNOSIS — M0579 Rheumatoid arthritis with rheumatoid factor of multiple sites without organ or systems involvement: Secondary | ICD-10-CM | POA: Diagnosis not present

## 2022-04-18 DIAGNOSIS — M19042 Primary osteoarthritis, left hand: Secondary | ICD-10-CM | POA: Diagnosis not present

## 2022-04-23 ENCOUNTER — Other Ambulatory Visit: Payer: Self-pay

## 2022-04-24 ENCOUNTER — Other Ambulatory Visit: Payer: Self-pay

## 2022-04-24 MED ORDER — ROSUVASTATIN CALCIUM 5 MG PO TABS
ORAL_TABLET | Freq: Every day | ORAL | 1 refills | Status: DC
  Filled 2022-04-24: qty 90, 90d supply, fill #0
  Filled 2022-08-26 – 2022-08-27 (×2): qty 90, 90d supply, fill #1

## 2022-05-05 ENCOUNTER — Other Ambulatory Visit: Payer: Self-pay

## 2022-05-06 ENCOUNTER — Other Ambulatory Visit: Payer: Self-pay

## 2022-05-08 ENCOUNTER — Other Ambulatory Visit: Payer: Self-pay

## 2022-05-08 MED ORDER — DESVENLAFAXINE SUCCINATE ER 50 MG PO TB24
ORAL_TABLET | ORAL | 1 refills | Status: DC
Start: 2022-05-08 — End: 2022-07-21
  Filled 2022-05-08: qty 90, 30d supply, fill #0
  Filled 2022-06-10: qty 90, 30d supply, fill #1

## 2022-06-02 ENCOUNTER — Other Ambulatory Visit: Payer: Self-pay

## 2022-06-03 ENCOUNTER — Other Ambulatory Visit: Payer: Self-pay

## 2022-06-03 MED ORDER — OMEPRAZOLE 20 MG PO CPDR
DELAYED_RELEASE_CAPSULE | ORAL | 1 refills | Status: DC
Start: 2022-06-03 — End: 2022-10-10
  Filled 2022-06-03: qty 90, 90d supply, fill #0
  Filled 2022-08-14: qty 90, 90d supply, fill #1

## 2022-06-10 ENCOUNTER — Other Ambulatory Visit: Payer: Self-pay

## 2022-07-21 ENCOUNTER — Other Ambulatory Visit: Payer: Self-pay

## 2022-07-21 MED ORDER — DESVENLAFAXINE SUCCINATE ER 50 MG PO TB24
ORAL_TABLET | ORAL | 1 refills | Status: DC
  Filled 2022-07-21: qty 90, 30d supply, fill #0
  Filled 2022-08-26 – 2022-08-27 (×2): qty 90, 30d supply, fill #1

## 2022-08-14 ENCOUNTER — Other Ambulatory Visit: Payer: Self-pay

## 2022-08-15 ENCOUNTER — Other Ambulatory Visit: Payer: Self-pay

## 2022-08-15 MED ORDER — METHOTREXATE SODIUM 2.5 MG PO TABS
ORAL_TABLET | ORAL | 1 refills | Status: DC
  Filled 2022-08-15: qty 84, 84d supply, fill #0
  Filled 2022-11-10: qty 84, 84d supply, fill #1

## 2022-08-21 ENCOUNTER — Other Ambulatory Visit: Payer: Self-pay

## 2022-08-21 DIAGNOSIS — Z79899 Other long term (current) drug therapy: Secondary | ICD-10-CM | POA: Diagnosis not present

## 2022-08-21 DIAGNOSIS — M0579 Rheumatoid arthritis with rheumatoid factor of multiple sites without organ or systems involvement: Secondary | ICD-10-CM | POA: Diagnosis not present

## 2022-08-21 MED ORDER — METHOTREXATE SODIUM 2.5 MG PO TABS: ORAL_TABLET | ORAL | 1 refills | Status: AC

## 2022-08-21 MED ORDER — FOLIC ACID 1 MG PO TABS
1.0000 mg | ORAL_TABLET | Freq: Every day | ORAL | 3 refills | Status: AC
  Filled 2022-08-21: qty 90, 90d supply, fill #0
  Filled 2023-03-11: qty 30, 30d supply, fill #0
  Filled 2023-04-08: qty 30, 30d supply, fill #1
  Filled 2023-05-08: qty 30, 30d supply, fill #2
  Filled 2023-06-05: qty 30, 30d supply, fill #3
  Filled 2023-07-15: qty 30, 30d supply, fill #4
  Filled 2023-08-14: qty 30, 30d supply, fill #5

## 2022-08-27 ENCOUNTER — Other Ambulatory Visit: Payer: Self-pay

## 2022-09-30 ENCOUNTER — Other Ambulatory Visit: Payer: Self-pay

## 2022-10-01 ENCOUNTER — Other Ambulatory Visit: Payer: Self-pay

## 2022-10-01 MED ORDER — DESVENLAFAXINE SUCCINATE ER 50 MG PO TB24
150.0000 mg | ORAL_TABLET | Freq: Every day | ORAL | 1 refills | Status: DC
  Filled 2022-10-01: qty 90, 30d supply, fill #0
  Filled 2022-11-10: qty 90, 30d supply, fill #1

## 2022-10-10 ENCOUNTER — Other Ambulatory Visit: Payer: Self-pay

## 2022-10-10 DIAGNOSIS — Z780 Asymptomatic menopausal state: Secondary | ICD-10-CM | POA: Diagnosis not present

## 2022-10-10 DIAGNOSIS — E782 Mixed hyperlipidemia: Secondary | ICD-10-CM | POA: Diagnosis not present

## 2022-10-10 DIAGNOSIS — F3341 Major depressive disorder, recurrent, in partial remission: Secondary | ICD-10-CM | POA: Diagnosis not present

## 2022-10-10 DIAGNOSIS — M0579 Rheumatoid arthritis with rheumatoid factor of multiple sites without organ or systems involvement: Secondary | ICD-10-CM | POA: Diagnosis not present

## 2022-10-10 DIAGNOSIS — K219 Gastro-esophageal reflux disease without esophagitis: Secondary | ICD-10-CM | POA: Diagnosis not present

## 2022-10-10 DIAGNOSIS — Z124 Encounter for screening for malignant neoplasm of cervix: Secondary | ICD-10-CM | POA: Diagnosis not present

## 2022-10-10 DIAGNOSIS — R7303 Prediabetes: Secondary | ICD-10-CM | POA: Diagnosis not present

## 2022-10-10 MED ORDER — ROSUVASTATIN CALCIUM 5 MG PO TABS
5.0000 mg | ORAL_TABLET | Freq: Every day | ORAL | 1 refills | Status: AC
  Filled 2022-10-10: qty 90, 90d supply, fill #0
  Filled 2023-04-08: qty 30, 30d supply, fill #1
  Filled 2023-04-29: qty 30, 30d supply, fill #2
  Filled 2023-06-05: qty 30, 30d supply, fill #3

## 2022-10-10 MED ORDER — OMEPRAZOLE 20 MG PO CPDR
20.0000 mg | DELAYED_RELEASE_CAPSULE | Freq: Every day | ORAL | 1 refills | Status: DC
  Filled 2022-10-10: qty 90, 90d supply, fill #0
  Filled 2023-03-09: qty 30, 30d supply, fill #1
  Filled 2023-04-10: qty 30, 30d supply, fill #2
  Filled 2023-05-08: qty 30, 30d supply, fill #3

## 2022-10-17 ENCOUNTER — Other Ambulatory Visit: Payer: Self-pay

## 2022-10-17 DIAGNOSIS — Z1211 Encounter for screening for malignant neoplasm of colon: Secondary | ICD-10-CM | POA: Diagnosis not present

## 2022-10-17 DIAGNOSIS — R159 Full incontinence of feces: Secondary | ICD-10-CM | POA: Diagnosis not present

## 2022-10-17 DIAGNOSIS — K219 Gastro-esophageal reflux disease without esophagitis: Secondary | ICD-10-CM | POA: Diagnosis not present

## 2022-10-17 MED ORDER — PEG 3350-KCL-NA BICARB-NACL 420 G PO SOLR
ORAL | 0 refills | Status: AC
  Filled 2022-10-17: qty 4000, 1d supply, fill #0

## 2022-12-07 IMAGING — DX DG CHEST 1V PORT
1 series · 1 of 1 positions shown · non-contrast
Comparison: September 28, 2019

CLINICAL DATA: Cough and fever

EXAM:
PORTABLE CHEST 1 VIEW

[chest ap]
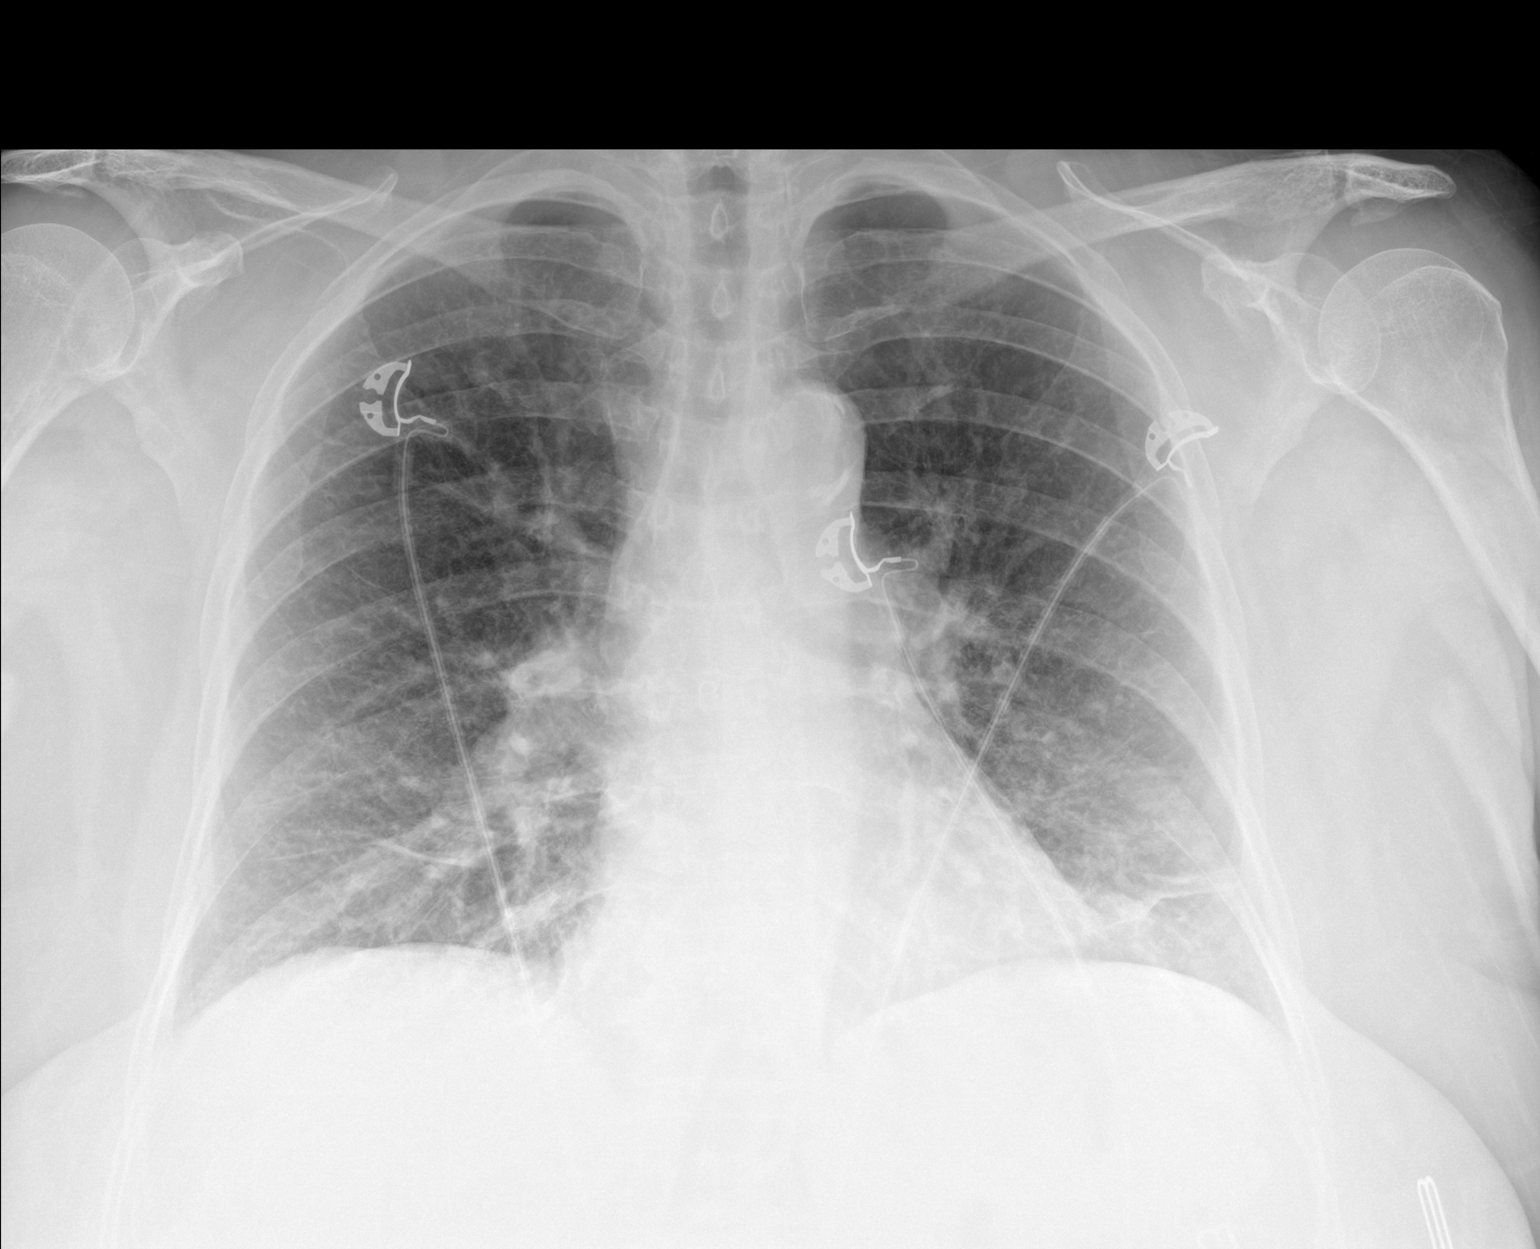

[1 of 1 positions shown; findings below may reference images not displayed]

FINDINGS: There is mild bibasilar scarring. There is no edema or airspace
opacity. Heart size and pulmonary vascularity are normal. No
adenopathy. There is aortic atherosclerosis. No bone lesions.
IMPRESSION: Bibasilar scarring. No edema or airspace opacity. Heart size within
normal limits. Aortic Atherosclerosis ([id number]-KK7.7).

## 2022-12-22 DIAGNOSIS — M0579 Rheumatoid arthritis with rheumatoid factor of multiple sites without organ or systems involvement: Secondary | ICD-10-CM | POA: Diagnosis not present

## 2022-12-22 DIAGNOSIS — Z79899 Other long term (current) drug therapy: Secondary | ICD-10-CM | POA: Diagnosis not present

## 2022-12-23 ENCOUNTER — Other Ambulatory Visit: Payer: Self-pay

## 2022-12-23 MED ORDER — DESVENLAFAXINE SUCCINATE ER 50 MG PO TB24
150.0000 mg | ORAL_TABLET | Freq: Every day | ORAL | 1 refills | Status: AC
  Filled 2022-12-23: qty 90, 30d supply, fill #0
  Filled 2023-02-03: qty 90, 30d supply, fill #1

## 2022-12-24 ENCOUNTER — Other Ambulatory Visit: Payer: Self-pay

## 2022-12-30 DIAGNOSIS — R7989 Other specified abnormal findings of blood chemistry: Secondary | ICD-10-CM | POA: Diagnosis not present

## 2022-12-30 DIAGNOSIS — Z79899 Other long term (current) drug therapy: Secondary | ICD-10-CM | POA: Diagnosis not present

## 2023-01-15 ENCOUNTER — Encounter: Payer: Self-pay | Admitting: *Deleted

## 2023-01-16 ENCOUNTER — Encounter: Payer: Self-pay | Admitting: *Deleted

## 2023-01-16 ENCOUNTER — Ambulatory Visit
Admission: RE | Admit: 2023-01-16 | Discharge: 2023-01-16 | Disposition: A | Discharge status: Home / Self Care | Payer: 59 | Attending: Gastroenterology | Admitting: Gastroenterology

## 2023-01-16 ENCOUNTER — Ambulatory Visit: Payer: 59 | Admitting: Anesthesiology

## 2023-01-16 ENCOUNTER — Encounter
Admission: RE | Disposition: A | Discharge status: Home / Self Care | Payer: Self-pay | Source: Home / Self Care | Attending: Gastroenterology

## 2023-01-16 DIAGNOSIS — K219 Gastro-esophageal reflux disease without esophagitis: Secondary | ICD-10-CM | POA: Diagnosis not present

## 2023-01-16 DIAGNOSIS — K5730 Diverticulosis of large intestine without perforation or abscess without bleeding: Secondary | ICD-10-CM | POA: Insufficient documentation

## 2023-01-16 DIAGNOSIS — K640 First degree hemorrhoids: Secondary | ICD-10-CM | POA: Insufficient documentation

## 2023-01-16 DIAGNOSIS — K649 Unspecified hemorrhoids: Secondary | ICD-10-CM | POA: Diagnosis not present

## 2023-01-16 DIAGNOSIS — E782 Mixed hyperlipidemia: Secondary | ICD-10-CM | POA: Diagnosis not present

## 2023-01-16 DIAGNOSIS — Z1211 Encounter for screening for malignant neoplasm of colon: Secondary | ICD-10-CM | POA: Diagnosis not present

## 2023-01-16 HISTORY — PX: COLONOSCOPY WITH PROPOFOL: SHX5780

## 2023-01-16 SURGERY — COLONOSCOPY WITH PROPOFOL
Anesthesia: General

## 2023-01-16 MED ORDER — PROPOFOL 10 MG/ML IV BOLUS
INTRAVENOUS | Status: DC | PRN
  Administered 2023-01-16 (×2): 70 mg via INTRAVENOUS

## 2023-01-16 MED ORDER — LIDOCAINE HCL (CARDIAC) PF 100 MG/5ML IV SOSY
PREFILLED_SYRINGE | INTRAVENOUS | Status: DC | PRN
  Administered 2023-01-16: 50 mg via INTRAVENOUS

## 2023-01-16 MED ORDER — PROPOFOL 10 MG/ML IV BOLUS
INTRAVENOUS | Status: AC
  Filled 2023-01-16: qty 20

## 2023-01-16 MED ORDER — PROPOFOL 10 MG/ML IV BOLUS
INTRAVENOUS | Status: AC
  Filled 2023-01-16: qty 40

## 2023-01-16 MED ORDER — LIDOCAINE HCL (PF) 2 % IJ SOLN
INTRAMUSCULAR | Status: AC
  Filled 2023-01-16: qty 5

## 2023-01-16 MED ORDER — PROPOFOL 500 MG/50ML IV EMUL
INTRAVENOUS | Status: DC | PRN
  Administered 2023-01-16: 75 ug/kg/min via INTRAVENOUS

## 2023-01-16 MED ORDER — SODIUM CHLORIDE 0.9 % IV SOLN
INTRAVENOUS | Status: DC
  Administered 2023-01-16: 20 mL/h via INTRAVENOUS

## 2023-01-16 NOTE — Anesthesia Postprocedure Evaluation (Signed)
Anesthesia Post Note  Patient: Karen Castro  Procedure(s) Performed: COLONOSCOPY WITH PROPOFOL  Patient location during evaluation: Endoscopy Anesthesia Type: General Level of consciousness: awake and alert Pain management: pain level controlled Vital Signs Assessment: post-procedure vital signs reviewed and stable Respiratory status: spontaneous breathing, nonlabored ventilation, respiratory function stable and patient connected to nasal cannula oxygen Cardiovascular status: blood pressure returned to baseline and stable Postop Assessment: no apparent nausea or vomiting Anesthetic complications: no   No notable events documented.   Last Vitals:  Vitals:   01/16/23 1057 01/16/23 1107  BP: (!) 144/79 (!) 150/78  Pulse: 69 70  Resp:    Temp:    SpO2: 96%     Last Pain:  Vitals:   01/16/23 1107  TempSrc:   PainSc: 0-No pain                 Lenard Simmer

## 2023-01-16 NOTE — Op Note (Signed)
[facility] Patient Name: Karen Castro Procedure Date: 01/16/2023 10:01 AM MRN: [id number] Account #: [id number] Date of Birth: [birth year] Admit Type: Outpatient Age: 62 Room: [facility] ENDO ROOM 3 Gender: Female Note Status: Finalized Instrument Name: Prentice Docker [id number] Procedure:             Colonoscopy Indications:           Screening for colorectal malignant neoplasm Providers:             Eather Colas MD, MD Medicines:             Propofol per Anesthesia Complications:         No immediate complications. Procedure:             Pre-Anesthesia Assessment:                        - Prior to the procedure, a History and Physical was                         performed, and patient medications and allergies were                         reviewed. The patient is competent. The risks and                         benefits of the procedure and the sedation options and                         risks were discussed with the patient. All questions                         were answered and informed consent was obtained.                         Patient identification and proposed procedure were                         verified by the physician, the nurse, the                         anesthesiologist, the anesthetist and the technician                         in the endoscopy suite. Mental Status Examination:                         alert and oriented. Airway Examination: normal                         oropharyngeal airway and neck mobility. Respiratory                         Examination: clear to auscultation. CV Examination:                         normal. Prophylactic Antibiotics: The patient does not                         require prophylactic antibiotics. Prior  Anticoagulants: The patient has taken no anticoagulant                         or antiplatelet agents. ASA Grade Assessment: III - A                         patient with severe  systemic disease. After reviewing                         the risks and benefits, the patient was deemed in                         satisfactory condition to undergo the procedure. The                         anesthesia plan was to use monitored anesthesia care                         (MAC). Immediately prior to administration of                         medications, the patient was re-assessed for adequacy                         to receive sedatives. The heart rate, respiratory                         rate, oxygen saturations, blood pressure, adequacy of                         pulmonary ventilation, and response to care were                         monitored throughout the procedure. The physical                         status of the patient was re-assessed after the                         procedure.                        After obtaining informed consent, the colonoscope was                         passed under direct vision. Throughout the procedure,                         the patient's blood pressure, pulse, and oxygen                         saturations were monitored continuously. The                         Colonoscope was introduced through the anus and                         advanced to the the terminal ileum. The colonoscopy  was somewhat difficult due to restricted mobility of                         the colon. The patient tolerated the procedure well.                         The quality of the bowel preparation was adequate to                         identify polyps. The ileocecal valve, appendiceal                         orifice, and rectum were photographed. Findings:      The perianal and digital rectal examinations were normal.      The terminal ileum appeared normal.      Many small-mouthed diverticula were found in the sigmoid colon and       descending colon.      Internal hemorrhoids were found during retroflexion. The hemorrhoids        were Grade I (internal hemorrhoids that do not prolapse).      The exam was otherwise without abnormality on direct and retroflexion       views. Impression:            - The examined portion of the ileum was normal.                        - Diverticulosis in the sigmoid colon and in the                         descending colon.                        - Internal hemorrhoids.                        - The examination was otherwise normal on direct and                         retroflexion views.                        - No specimens collected. Recommendation:        - Discharge patient to home.                        - Resume previous diet.                        - Continue present medications.                        - Repeat colonoscopy in 10 years for screening                         purposes.                        - Return to referring physician as previously                         scheduled. Procedure Code(s):     ---  Professional ---                        Z3086, Colorectal cancer screening; colonoscopy on                         individual not meeting criteria for high risk Diagnosis Code(s):     --- Professional ---                        Z12.11, Encounter for screening for malignant neoplasm                         of colon                        K64.0, First degree hemorrhoids                        K57.30, Diverticulosis of large intestine without                         perforation or abscess without bleeding CPT copyright 2022 [employer]. All rights reserved. The codes documented in this report are preliminary and upon coder review may  be revised to meet current compliance requirements. Eather Colas MD, MD 01/16/2023 10:48:22 AM Number of Addenda: 0 Note Initiated On: 01/16/2023 10:01 AM Scope Withdrawal Time: 0 hours 11 minutes 2 seconds  Total Procedure Duration: 0 hours 18 minutes 58 seconds  Estimated Blood Loss:  Estimated blood loss: none.       [facility]

## 2023-01-16 NOTE — Anesthesia Preprocedure Evaluation (Addendum)
Anesthesia Evaluation  Patient identified by MRN, date of birth, ID band Patient awake    Reviewed: Allergy & Precautions, H&P , NPO status , Patient's Chart, lab work & pertinent test results, reviewed documented beta blocker date and time   History of Anesthesia Complications Negative for: history of anesthetic complications  Airway Mallampati: II  TM Distance: >3 FB Neck ROM: full    Dental  (+) Dental Advidsory Given, Caps, Missing, Poor Dentition   Pulmonary neg shortness of breath, asthma , neg sleep apnea, neg COPD, neg recent URI, Current Smoker, former smoker   Pulmonary exam normal breath sounds clear to auscultation       Cardiovascular Exercise Tolerance: Good (-) hypertension(-) angina + Peripheral Vascular Disease  (-) Past MI and (-) Cardiac Stents Normal cardiovascular exam(-) dysrhythmias (-) Valvular Problems/Murmurs Rhythm:regular Rate:Normal     Neuro/Psych  PSYCHIATRIC DISORDERS  Depression    negative neurological ROS     GI/Hepatic Neg liver ROS,GERD  Medicated,,  Endo/Other  negative endocrine ROS    Renal/GU negative Renal ROS  negative genitourinary   Musculoskeletal   Abdominal   Peds  Hematology negative hematology ROS (+)   Anesthesia Other Findings Past Medical History: No date: Arthritis No date: Hypercholesteremia   Reproductive/Obstetrics negative OB ROS                             Anesthesia Physical Anesthesia Plan  ASA: 3  Anesthesia Plan: General   Post-op Pain Management:    Induction: Intravenous  PONV Risk Score and Plan: 2 and Propofol infusion and TIVA  Airway Management Planned: Natural Airway and Nasal Cannula  Additional Equipment:   Intra-op Plan:   Post-operative Plan:   Informed Consent: I have reviewed the patients History and Physical, chart, labs and discussed the procedure including the risks, benefits and alternatives  for the proposed anesthesia with the patient or authorized representative who has indicated his/her understanding and acceptance.     Dental Advisory Given  Plan Discussed with: Anesthesiologist, CRNA and Surgeon  Anesthesia Plan Comments:        Anesthesia Quick Evaluation

## 2023-01-16 NOTE — H&P (Signed)
Outpatient short stay form Pre-procedure 01/16/2023  Regis Bill, MD  Primary Physician: Carlean Jews, PA-C  Reason for visit:  Screening  History of present illness:    62 y/o lady with history of HLD, RA, GERD, and obesity here for screening colonoscopy. No family history of GI malignancies. No blood thinners. History of hysterectomy.     Current Facility-Administered Medications:    0.9 %  sodium chloride infusion, , Intravenous, Continuous, Laresa Oshiro, Rossie Muskrat, MD, Last Rate: 20 mL/hr at 01/16/23 1004, 20 mL/hr at 01/16/23 1004  Medications Prior to Admission  Medication Sig Dispense Refill Last Dose   Adalimumab (HUMIRA PEN) 40 MG/0.8ML PNKT Inject 0.8 mLs (40 mg total) subcutaneously every 14 (fourteen) days for 30 days 6 each 3 01/15/2023   amoxicillin-clavulanate (AUGMENTIN) 875-125 MG tablet Take 1 tablet by mouth 2 (two) times daily. 20 tablet 0 01/15/2023   azithromycin (ZITHROMAX) 250 MG tablet Take 2 tablets (500mg ) by mouth on Day 1. Take 1 tablet (250mg ) by mouth on Days 2-5. 6 tablet 0 01/15/2023   desvenlafaxine (PRISTIQ) 50 MG 24 hr tablet Take 3 tablets (150 mg total) by mouth daily. 90 tablet 1 01/15/2023   fexofenadine (ALLEGRA) 180 MG tablet Take 180 mg by mouth daily.   01/15/2023   folic acid (FOLVITE) 1 MG tablet Take 1 mg by mouth daily.    01/15/2023   folic acid (FOLVITE) 1 MG tablet TAKE 1 TABLET BY MOUTH ONCE A DAY 90 tablet 3 01/15/2023   folic acid (FOLVITE) 1 MG tablet Take 1 tablet (1 mg total) by mouth daily. 90 tablet 3 01/15/2023   methotrexate (RHEUMATREX) 2.5 MG tablet Take 17.5 mg by mouth once a week. Wednesday   01/15/2023   methotrexate (RHEUMATREX) 2.5 MG tablet Take 7 tablets (17.5 mg total) by mouth every 7 (seven) days 84 tablet 1 01/15/2023   methotrexate (RHEUMATREX) 2.5 MG tablet Take 7 tablets (17.5 mg total) by mouth every 7 (seven) days 84 tablet 1 01/15/2023   methotrexate 2.5 MG tablet TAKE 7 TABLETS (17.5 MG TOTAL) BY MOUTH EVERY  7 (SEVEN) DAYS 84 tablet 1 01/15/2023   nicotine (NICODERM CQ - DOSED IN MG/24 HOURS) 14 mg/24hr patch Place 1 patch onto the skin daily 28 patch 0 01/15/2023   nystatin (MYCOSTATIN/NYSTOP) powder Apply topically 4 (four) times daily. 60 g 2 01/15/2023   omeprazole (PRILOSEC) 20 MG capsule Take 1 capsule (20 mg total) by mouth once daily. 90 capsule 1 01/15/2023   polyethylene glycol-electrolytes (NULYTELY) 420 g solution Use as directed. 4000 mL 0 01/15/2023   predniSONE (DELTASONE) 20 MG tablet Take 2 tablet daily for 4 days followed by 1 tablet daily for 4 days and then stop 12 tablet 0 01/15/2023   promethazine-dextromethorphan (PROMETHAZINE-DM) 6.25-15 MG/5ML syrup Take 5 mLs by mouth 3 (three) times daily as needed for cough. 140 mL 0 01/15/2023   rosuvastatin (CRESTOR) 5 MG tablet Take 1 tablet (5 mg total) by mouth once daily. 90 tablet 1 01/15/2023   valACYclovir (VALTREX) 1000 MG tablet Take 2 tablets (2,000 mg total) by mouth every 12 (twelve) hours. 8 tablet 1 01/15/2023   albuterol (VENTOLIN HFA) 108 (90 Base) MCG/ACT inhaler INHALE 2 PUFFS BY MOUTH EVERY 6 HOURS AS NEEDED FOR WHEEZING OR SHORTNESS OF BREATH. 18 g 3    desvenlafaxine (PRISTIQ) 50 MG 24 hr tablet TAKE 3 TABLETS BY MOUTH DAILY (Patient taking differently: Take 150 mg by mouth daily. Take 3 tablets by mouth daily) 90 tablet  3    desvenlafaxine (PRISTIQ) 50 MG 24 hr tablet TAKE 3 TABLETS (150 MG) BY MOUTH DAILY. 270 tablet 1    desvenlafaxine (PRISTIQ) 50 MG 24 hr tablet TAKE 3 TABLETS BY MOUTH DAILY 90 tablet 0    nirmatrelvir & ritonavir (PAXLOVID, 300/100,) 20 x 150 MG & 10 x 100MG  TBPK Take 2 (two) pink tablets (300 mg nirmatrelvir) with 1 (one) tablet (100 mg ritonavir) by mouth twice a day for 5 days. 30 each 0    nystatin (MYCOSTATIN/NYSTOP) powder Apply topically 2 (two) times daily 30 g 0    valACYclovir (VALTREX) 1000 MG tablet Take 2 tablets (2,000 mg total) by mouth 2 (two) times daily for 2 days 10 tablet 1      No  Known Allergies   Past Medical History:  Diagnosis Date   Arthritis    Hypercholesteremia     Review of systems:  Otherwise negative.    Physical Exam  Gen: Alert, oriented. Appears stated age.  HEENT: PERRLA. Lungs: No respiratory distress CV: RRR Abd: soft, benign, no masses Ext: No edema    Planned procedures: Proceed with colonoscopy. The patient understands the nature of the planned procedure, indications, risks, alternatives and potential complications including but not limited to bleeding, infection, perforation, damage to internal organs and possible oversedation/side effects from anesthesia. The patient agrees and gives consent to proceed.  Please refer to procedure notes for findings, recommendations and patient disposition/instructions.     Regis Bill, MD [facility]

## 2023-01-16 NOTE — Transfer of Care (Signed)
Immediate Anesthesia Transfer of Care Note  Patient: Karen Castro  Procedure(s) Performed: COLONOSCOPY WITH PROPOFOL  Patient Location: PACU  Anesthesia Type:General  Level of Consciousness: awake, oriented, and patient cooperative  Airway & Oxygen Therapy: Patient Spontanous Breathing  Post-op Assessment: Report given to RN and Post -op Vital signs reviewed and stable  Post vital signs: Reviewed and stable  Last Vitals:  Vitals Value Taken Time  BP 116/67   Temp    Pulse 74   Resp 18   SpO2 96     Last Pain:  Vitals:   01/16/23 0952  TempSrc: Temporal  PainSc: 0-No pain         Complications: No notable events documented.

## 2023-01-16 NOTE — Interval H&P Note (Signed)
History and Physical Interval Note:  01/16/2023 10:10 AM  Karen Castro  has presented today for surgery, with the diagnosis of Colon Cancer Screening.  The various methods of treatment have been discussed with the patient and family. After consideration of risks, benefits and other options for treatment, the patient has consented to  Procedure(s): COLONOSCOPY WITH PROPOFOL (N/A) as a surgical intervention.  The patient's history has been reviewed, patient examined, no change in status, stable for surgery.  I have reviewed the patient's chart and labs.  Questions were answered to the patient's satisfaction.     Regis Bill  Ok to proceed with colonoscopy

## 2023-01-19 ENCOUNTER — Encounter: Payer: Self-pay | Admitting: Gastroenterology

## 2023-01-22 ENCOUNTER — Ambulatory Visit
Admission: RE | Admit: 2023-01-22 | Discharge: 2023-01-22 | Disposition: A | Discharge status: Home / Self Care | Payer: 59 | Source: Ambulatory Visit | Attending: Internal Medicine | Admitting: Internal Medicine

## 2023-01-22 DIAGNOSIS — Z1231 Encounter for screening mammogram for malignant neoplasm of breast: Secondary | ICD-10-CM | POA: Diagnosis not present

## 2023-02-16 ENCOUNTER — Other Ambulatory Visit: Payer: Self-pay

## 2023-02-16 MED ORDER — METHOTREXATE SODIUM 2.5 MG PO TABS
ORAL_TABLET | ORAL | 1 refills | Status: AC
  Filled 2023-02-16: qty 84, 84d supply, fill #0

## 2023-02-17 ENCOUNTER — Other Ambulatory Visit: Payer: Self-pay

## 2023-02-17 MED ORDER — NYSTATIN 100000 UNIT/GM EX POWD
1.0000 | Freq: Two times a day (BID) | CUTANEOUS | 2 refills | Status: AC
  Filled 2023-02-17: qty 30, 30d supply, fill #0

## 2023-03-09 ENCOUNTER — Other Ambulatory Visit: Payer: Self-pay

## 2023-03-11 ENCOUNTER — Other Ambulatory Visit: Payer: Self-pay

## 2023-03-12 ENCOUNTER — Other Ambulatory Visit: Payer: Self-pay

## 2023-03-12 MED ORDER — DESVENLAFAXINE SUCCINATE ER 50 MG PO TB24
150.0000 mg | ORAL_TABLET | Freq: Every day | ORAL | 1 refills | Status: DC
  Filled 2023-03-12: qty 90, 30d supply, fill #0
  Filled 2023-04-10: qty 90, 30d supply, fill #1

## 2023-03-13 ENCOUNTER — Other Ambulatory Visit: Payer: Self-pay

## 2023-03-13 MED ORDER — FOLIC ACID 1 MG PO TABS
1.0000 mg | ORAL_TABLET | Freq: Every day | ORAL | 3 refills | Status: AC
  Filled 2023-03-13: qty 30, 30d supply, fill #0

## 2023-04-06 DIAGNOSIS — E782 Mixed hyperlipidemia: Secondary | ICD-10-CM | POA: Diagnosis not present

## 2023-04-06 DIAGNOSIS — M0579 Rheumatoid arthritis with rheumatoid factor of multiple sites without organ or systems involvement: Secondary | ICD-10-CM | POA: Diagnosis not present

## 2023-04-06 DIAGNOSIS — R7303 Prediabetes: Secondary | ICD-10-CM | POA: Diagnosis not present

## 2023-04-08 ENCOUNTER — Other Ambulatory Visit: Payer: Self-pay

## 2023-04-10 ENCOUNTER — Other Ambulatory Visit: Payer: Self-pay

## 2023-04-10 ENCOUNTER — Other Ambulatory Visit: Payer: Self-pay | Admitting: Internal Medicine

## 2023-04-10 DIAGNOSIS — R600 Localized edema: Secondary | ICD-10-CM | POA: Diagnosis not present

## 2023-04-10 DIAGNOSIS — E782 Mixed hyperlipidemia: Secondary | ICD-10-CM | POA: Diagnosis not present

## 2023-04-10 DIAGNOSIS — Z0000 Encounter for general adult medical examination without abnormal findings: Secondary | ICD-10-CM | POA: Diagnosis not present

## 2023-04-10 DIAGNOSIS — Z818 Family history of other mental and behavioral disorders: Secondary | ICD-10-CM | POA: Diagnosis not present

## 2023-04-10 DIAGNOSIS — F3341 Major depressive disorder, recurrent, in partial remission: Secondary | ICD-10-CM | POA: Diagnosis not present

## 2023-04-10 DIAGNOSIS — K219 Gastro-esophageal reflux disease without esophagitis: Secondary | ICD-10-CM | POA: Diagnosis not present

## 2023-04-10 DIAGNOSIS — I7143 Infrarenal abdominal aortic aneurysm, without rupture: Secondary | ICD-10-CM | POA: Diagnosis not present

## 2023-04-10 DIAGNOSIS — M0579 Rheumatoid arthritis with rheumatoid factor of multiple sites without organ or systems involvement: Secondary | ICD-10-CM | POA: Diagnosis not present

## 2023-04-10 DIAGNOSIS — R0602 Shortness of breath: Secondary | ICD-10-CM | POA: Diagnosis not present

## 2023-04-10 MED ORDER — ALBUTEROL SULFATE HFA 108 (90 BASE) MCG/ACT IN AERS
2.0000 | INHALATION_SPRAY | Freq: Four times a day (QID) | RESPIRATORY_TRACT | 2 refills | Status: AC | PRN
  Filled 2023-04-10: qty 6.7, 25d supply, fill #0

## 2023-04-10 MED ORDER — FEXOFENADINE HCL 180 MG PO TABS
180.0000 mg | ORAL_TABLET | Freq: Every day | ORAL | 1 refills | Status: DC
  Filled 2023-04-10: qty 30, 30d supply, fill #0
  Filled 2023-08-24: qty 30, 30d supply, fill #1

## 2023-04-10 MED ORDER — ROSUVASTATIN CALCIUM 5 MG PO TABS
5.0000 mg | ORAL_TABLET | Freq: Every day | ORAL | 1 refills | Status: AC
  Filled 2023-04-10: qty 30, 30d supply, fill #0

## 2023-04-10 MED ORDER — NYSTATIN 100000 UNIT/GM EX POWD
1.0000 | Freq: Two times a day (BID) | CUTANEOUS | 2 refills | Status: AC
  Filled 2023-04-10: qty 30, 30d supply, fill #0

## 2023-04-10 MED ORDER — METHOTREXATE SODIUM 2.5 MG PO TABS
17.5000 mg | ORAL_TABLET | ORAL | 1 refills | Status: DC
  Filled 2023-04-10: qty 28, 28d supply, fill #0
  Filled 2023-06-05: qty 28, 28d supply, fill #1
  Filled 2023-07-08: qty 28, 28d supply, fill #2
  Filled 2023-08-03: qty 28, 28d supply, fill #3
  Filled 2023-08-27: qty 28, 28d supply, fill #4
  Filled 2023-09-22 – 2023-10-14 (×2): qty 28, 28d supply, fill #5

## 2023-04-10 MED ORDER — OMEPRAZOLE 20 MG PO CPDR
20.0000 mg | DELAYED_RELEASE_CAPSULE | Freq: Every day | ORAL | 1 refills | Status: AC
  Filled 2023-04-10: qty 30, 30d supply, fill #0

## 2023-04-10 MED ORDER — DESVENLAFAXINE SUCCINATE ER 50 MG PO TB24
150.0000 mg | ORAL_TABLET | Freq: Every day | ORAL | 1 refills | Status: AC
  Filled 2023-04-10: qty 90, 30d supply, fill #0

## 2023-04-10 MED ORDER — FOLIC ACID 1 MG PO TABS
1.0000 mg | ORAL_TABLET | Freq: Every day | ORAL | 3 refills | Status: AC
  Filled 2023-04-10 – 2023-09-18 (×2): qty 30, 30d supply, fill #0
  Filled 2023-10-20: qty 30, 30d supply, fill #1
  Filled 2023-11-18: qty 30, 30d supply, fill #2
  Filled 2023-12-22: qty 30, 30d supply, fill #3
  Filled 2024-01-19: qty 30, 30d supply, fill #4
  Filled 2024-02-18: qty 30, 30d supply, fill #5
  Filled 2024-03-21: qty 30, 30d supply, fill #6

## 2023-04-13 ENCOUNTER — Ambulatory Visit
Admission: RE | Admit: 2023-04-13 | Discharge: 2023-04-13 | Disposition: A | Discharge status: Home / Self Care | Payer: 59 | Source: Ambulatory Visit | Attending: Internal Medicine | Admitting: Internal Medicine

## 2023-04-13 DIAGNOSIS — R600 Localized edema: Secondary | ICD-10-CM | POA: Diagnosis not present

## 2023-04-13 DIAGNOSIS — M7989 Other specified soft tissue disorders: Secondary | ICD-10-CM | POA: Diagnosis not present

## 2023-04-21 DIAGNOSIS — R0602 Shortness of breath: Secondary | ICD-10-CM | POA: Diagnosis not present

## 2023-04-24 ENCOUNTER — Other Ambulatory Visit: Payer: Self-pay

## 2023-04-24 DIAGNOSIS — Z79899 Other long term (current) drug therapy: Secondary | ICD-10-CM | POA: Diagnosis not present

## 2023-04-24 DIAGNOSIS — M0579 Rheumatoid arthritis with rheumatoid factor of multiple sites without organ or systems involvement: Secondary | ICD-10-CM | POA: Diagnosis not present

## 2023-04-24 MED ORDER — PREDNISONE 5 MG PO TABS
ORAL_TABLET | ORAL | 0 refills | Status: AC
  Filled 2023-04-24: qty 30, 12d supply, fill #0

## 2023-04-29 ENCOUNTER — Other Ambulatory Visit: Payer: Self-pay

## 2023-05-07 DIAGNOSIS — R918 Other nonspecific abnormal finding of lung field: Secondary | ICD-10-CM | POA: Diagnosis not present

## 2023-05-07 DIAGNOSIS — R0602 Shortness of breath: Secondary | ICD-10-CM | POA: Diagnosis not present

## 2023-05-07 DIAGNOSIS — J984 Other disorders of lung: Secondary | ICD-10-CM | POA: Diagnosis not present

## 2023-05-08 ENCOUNTER — Other Ambulatory Visit: Payer: Self-pay

## 2023-05-08 ENCOUNTER — Other Ambulatory Visit (HOSPITAL_COMMUNITY): Payer: Self-pay

## 2023-05-08 MED ORDER — HUMIRA (2 PEN) 40 MG/0.4ML ~~LOC~~ AJKT
AUTO-INJECTOR | SUBCUTANEOUS | 1 refills | Status: AC
  Filled 2023-05-08: qty 6, 90d supply, fill #0

## 2023-05-08 MED ORDER — DESVENLAFAXINE SUCCINATE ER 50 MG PO TB24
150.0000 mg | ORAL_TABLET | Freq: Every day | ORAL | 1 refills | Status: AC
  Filled 2023-05-08: qty 90, 30d supply, fill #0
  Filled 2023-06-05: qty 90, 30d supply, fill #1

## 2023-05-09 ENCOUNTER — Other Ambulatory Visit (HOSPITAL_COMMUNITY): Payer: Self-pay

## 2023-06-05 ENCOUNTER — Other Ambulatory Visit: Payer: Self-pay

## 2023-06-05 MED ORDER — OMEPRAZOLE 20 MG PO CPDR
20.0000 mg | DELAYED_RELEASE_CAPSULE | Freq: Every day | ORAL | 1 refills | Status: DC
  Filled 2023-06-05: qty 90, 90d supply, fill #0
  Filled 2023-09-16: qty 90, 90d supply, fill #1

## 2023-06-23 ENCOUNTER — Other Ambulatory Visit: Payer: Self-pay

## 2023-06-23 DIAGNOSIS — F3341 Major depressive disorder, recurrent, in partial remission: Secondary | ICD-10-CM | POA: Diagnosis not present

## 2023-06-23 DIAGNOSIS — M0579 Rheumatoid arthritis with rheumatoid factor of multiple sites without organ or systems involvement: Secondary | ICD-10-CM | POA: Diagnosis not present

## 2023-06-23 DIAGNOSIS — R0602 Shortness of breath: Secondary | ICD-10-CM | POA: Diagnosis not present

## 2023-06-23 DIAGNOSIS — E782 Mixed hyperlipidemia: Secondary | ICD-10-CM | POA: Diagnosis not present

## 2023-06-23 DIAGNOSIS — I7143 Infrarenal abdominal aortic aneurysm, without rupture: Secondary | ICD-10-CM | POA: Diagnosis not present

## 2023-06-23 MED ORDER — ROSUVASTATIN CALCIUM 5 MG PO TABS
5.0000 mg | ORAL_TABLET | Freq: Every day | ORAL | 3 refills | Status: DC
  Filled 2023-06-23: qty 30, 30d supply, fill #0
  Filled 2023-08-12: qty 30, 30d supply, fill #1
  Filled 2023-09-16: qty 30, 30d supply, fill #2
  Filled 2023-10-14: qty 30, 30d supply, fill #3
  Filled 2023-11-18: qty 30, 30d supply, fill #4
  Filled 2023-12-22: qty 30, 30d supply, fill #5
  Filled 2024-01-19: qty 30, 30d supply, fill #6
  Filled 2024-02-18: qty 30, 30d supply, fill #7
  Filled 2024-03-21: qty 30, 30d supply, fill #8
  Filled 2024-04-26: qty 30, 30d supply, fill #9
  Filled 2024-05-23: qty 30, 30d supply, fill #10

## 2023-06-23 MED ORDER — DESVENLAFAXINE SUCCINATE ER 50 MG PO TB24
150.0000 mg | ORAL_TABLET | Freq: Every day | ORAL | 3 refills | Status: DC
  Filled 2023-06-23: qty 90, 30d supply, fill #0
  Filled 2023-08-14: qty 90, 30d supply, fill #1
  Filled 2023-09-16: qty 90, 30d supply, fill #2
  Filled 2023-10-20: qty 90, 30d supply, fill #3

## 2023-07-01 DIAGNOSIS — R4189 Other symptoms and signs involving cognitive functions and awareness: Secondary | ICD-10-CM | POA: Diagnosis not present

## 2023-08-24 ENCOUNTER — Other Ambulatory Visit: Payer: Self-pay

## 2023-09-01 DIAGNOSIS — M0579 Rheumatoid arthritis with rheumatoid factor of multiple sites without organ or systems involvement: Secondary | ICD-10-CM | POA: Diagnosis not present

## 2023-09-16 ENCOUNTER — Other Ambulatory Visit: Payer: Self-pay

## 2023-09-18 ENCOUNTER — Other Ambulatory Visit: Payer: Self-pay

## 2023-09-22 ENCOUNTER — Other Ambulatory Visit: Payer: Self-pay

## 2023-10-06 ENCOUNTER — Other Ambulatory Visit: Payer: Self-pay

## 2023-10-14 ENCOUNTER — Other Ambulatory Visit: Payer: Self-pay

## 2023-11-03 ENCOUNTER — Other Ambulatory Visit: Payer: Self-pay

## 2023-11-03 MED ORDER — METHOTREXATE SODIUM 2.5 MG PO TABS
17.5000 mg | ORAL_TABLET | ORAL | 1 refills | Status: DC
  Filled 2023-11-03: qty 28, 28d supply, fill #0
  Filled 2023-12-08: qty 28, 28d supply, fill #1
  Filled 2024-01-06: qty 28, 28d supply, fill #2
  Filled 2024-02-01: qty 28, 28d supply, fill #3
  Filled 2024-02-24: qty 28, 28d supply, fill #4
  Filled 2024-03-21: qty 28, 28d supply, fill #5

## 2023-11-05 ENCOUNTER — Other Ambulatory Visit: Payer: Self-pay

## 2023-11-18 ENCOUNTER — Other Ambulatory Visit: Payer: Self-pay

## 2023-11-18 MED ORDER — DESVENLAFAXINE SUCCINATE ER 50 MG PO TB24
150.0000 mg | ORAL_TABLET | Freq: Every day | ORAL | 3 refills | Status: DC
  Filled 2023-11-18: qty 90, 30d supply, fill #0
  Filled 2023-12-22: qty 90, 30d supply, fill #1
  Filled 2024-01-19: qty 90, 30d supply, fill #2
  Filled 2024-02-18: qty 90, 30d supply, fill #3

## 2023-11-20 ENCOUNTER — Other Ambulatory Visit: Payer: Self-pay

## 2023-11-20 DIAGNOSIS — E782 Mixed hyperlipidemia: Secondary | ICD-10-CM | POA: Diagnosis not present

## 2023-11-20 DIAGNOSIS — R040 Epistaxis: Secondary | ICD-10-CM | POA: Diagnosis not present

## 2023-11-20 DIAGNOSIS — Z6841 Body Mass Index (BMI) 40.0 and over, adult: Secondary | ICD-10-CM | POA: Diagnosis not present

## 2023-11-20 DIAGNOSIS — F3341 Major depressive disorder, recurrent, in partial remission: Secondary | ICD-10-CM | POA: Diagnosis not present

## 2023-11-20 DIAGNOSIS — M0579 Rheumatoid arthritis with rheumatoid factor of multiple sites without organ or systems involvement: Secondary | ICD-10-CM | POA: Diagnosis not present

## 2023-11-20 MED ORDER — PHENTERMINE HCL 15 MG PO CAPS
15.0000 mg | ORAL_CAPSULE | Freq: Every day | ORAL | 0 refills | Status: DC
  Filled 2023-11-20: qty 30, 30d supply, fill #0

## 2023-11-20 MED ORDER — WEGOVY 0.25 MG/0.5ML ~~LOC~~ SOAJ
SUBCUTANEOUS | 1 refills | Status: DC
Start: 2023-11-20 — End: 2024-02-23
  Filled 2023-11-20 – 2023-12-15 (×3): qty 2, 28d supply, fill #0

## 2023-11-20 MED ORDER — DESVENLAFAXINE SUCCINATE ER 50 MG PO TB24
150.0000 mg | ORAL_TABLET | Freq: Every day | ORAL | 3 refills | Status: AC
  Filled 2023-11-20: qty 90, 30d supply, fill #0
  Filled 2024-05-09: qty 270, 90d supply, fill #0

## 2023-11-24 ENCOUNTER — Other Ambulatory Visit: Payer: Self-pay

## 2023-11-27 ENCOUNTER — Other Ambulatory Visit: Payer: Self-pay

## 2023-11-30 DIAGNOSIS — M0579 Rheumatoid arthritis with rheumatoid factor of multiple sites without organ or systems involvement: Secondary | ICD-10-CM | POA: Diagnosis not present

## 2023-11-30 DIAGNOSIS — Z79899 Other long term (current) drug therapy: Secondary | ICD-10-CM | POA: Diagnosis not present

## 2023-12-15 ENCOUNTER — Other Ambulatory Visit: Payer: Self-pay

## 2023-12-22 ENCOUNTER — Other Ambulatory Visit: Payer: Self-pay

## 2023-12-22 MED ORDER — OMEPRAZOLE 20 MG PO CPDR
20.0000 mg | DELAYED_RELEASE_CAPSULE | Freq: Every day | ORAL | 1 refills | Status: DC
  Filled 2023-12-22: qty 90, 90d supply, fill #0
  Filled 2024-03-21: qty 90, 90d supply, fill #1

## 2023-12-31 ENCOUNTER — Other Ambulatory Visit: Payer: Self-pay

## 2023-12-31 MED ORDER — PREDNISONE 5 MG PO TABS
ORAL_TABLET | ORAL | 0 refills | Status: AC
  Filled 2023-12-31: qty 30, 12d supply, fill #0

## 2024-01-06 ENCOUNTER — Other Ambulatory Visit: Payer: Self-pay

## 2024-01-06 MED ORDER — PHENTERMINE HCL 15 MG PO CAPS
15.0000 mg | ORAL_CAPSULE | Freq: Every day | ORAL | 0 refills | Status: DC
  Filled 2024-01-06: qty 30, 30d supply, fill #0

## 2024-02-02 ENCOUNTER — Other Ambulatory Visit: Payer: Self-pay

## 2024-02-04 ENCOUNTER — Other Ambulatory Visit: Payer: Self-pay

## 2024-02-04 ENCOUNTER — Other Ambulatory Visit: Payer: Self-pay | Admitting: Internal Medicine

## 2024-02-04 DIAGNOSIS — Z1231 Encounter for screening mammogram for malignant neoplasm of breast: Secondary | ICD-10-CM

## 2024-02-04 MED ORDER — PHENTERMINE HCL 15 MG PO CAPS
15.0000 mg | ORAL_CAPSULE | Freq: Every day | ORAL | 0 refills | Status: DC
  Filled 2024-02-04: qty 30, 30d supply, fill #0

## 2024-02-23 ENCOUNTER — Other Ambulatory Visit: Payer: Self-pay

## 2024-02-23 DIAGNOSIS — E782 Mixed hyperlipidemia: Secondary | ICD-10-CM | POA: Diagnosis not present

## 2024-02-23 DIAGNOSIS — F339 Major depressive disorder, recurrent, unspecified: Secondary | ICD-10-CM | POA: Diagnosis not present

## 2024-02-23 DIAGNOSIS — F3341 Major depressive disorder, recurrent, in partial remission: Secondary | ICD-10-CM | POA: Diagnosis not present

## 2024-02-23 DIAGNOSIS — Z6841 Body Mass Index (BMI) 40.0 and over, adult: Secondary | ICD-10-CM | POA: Diagnosis not present

## 2024-02-23 DIAGNOSIS — M0579 Rheumatoid arthritis with rheumatoid factor of multiple sites without organ or systems involvement: Secondary | ICD-10-CM | POA: Diagnosis not present

## 2024-02-23 DIAGNOSIS — R7303 Prediabetes: Secondary | ICD-10-CM | POA: Diagnosis not present

## 2024-02-23 DIAGNOSIS — K219 Gastro-esophageal reflux disease without esophagitis: Secondary | ICD-10-CM | POA: Diagnosis not present

## 2024-02-23 MED ORDER — NYSTATIN 100000 UNIT/GM EX POWD
1.0000 | Freq: Two times a day (BID) | CUTANEOUS | 2 refills | Status: AC
  Filled 2024-02-23: qty 60, 30d supply, fill #0

## 2024-02-23 MED ORDER — WEGOVY 0.25 MG/0.5ML ~~LOC~~ SOAJ
0.2500 mg | SUBCUTANEOUS | 1 refills | Status: DC
Start: 2024-02-23 — End: 2024-06-01
  Filled 2024-02-23 – 2024-03-22 (×4): qty 2, 28d supply, fill #0

## 2024-02-23 MED ORDER — PHENTERMINE HCL 37.5 MG PO CAPS
37.5000 mg | ORAL_CAPSULE | ORAL | 0 refills | Status: AC
Start: 2024-02-23 — End: ?
  Filled 2024-02-23: qty 30, 30d supply, fill #0

## 2024-02-25 ENCOUNTER — Other Ambulatory Visit: Payer: Self-pay

## 2024-02-26 ENCOUNTER — Other Ambulatory Visit: Payer: Self-pay

## 2024-03-01 ENCOUNTER — Other Ambulatory Visit: Payer: Self-pay

## 2024-03-16 ENCOUNTER — Ambulatory Visit
Admission: RE | Admit: 2024-03-16 | Discharge: 2024-03-16 | Disposition: A | Discharge status: Home / Self Care | Source: Ambulatory Visit | Attending: Internal Medicine | Admitting: Internal Medicine

## 2024-03-16 DIAGNOSIS — Z1231 Encounter for screening mammogram for malignant neoplasm of breast: Secondary | ICD-10-CM | POA: Insufficient documentation

## 2024-03-21 ENCOUNTER — Other Ambulatory Visit: Payer: Self-pay

## 2024-03-21 MED ORDER — DESVENLAFAXINE SUCCINATE ER 50 MG PO TB24
150.0000 mg | ORAL_TABLET | Freq: Every day | ORAL | 3 refills | Status: DC
  Filled 2024-03-21: qty 90, 30d supply, fill #0
  Filled 2024-04-26: qty 30, 30d supply, fill #1
  Filled 2024-05-06: qty 30, 30d supply, fill #2
  Filled 2024-05-09: qty 60, 20d supply, fill #2
  Filled 2024-05-23: qty 90, 30d supply, fill #3

## 2024-03-22 ENCOUNTER — Other Ambulatory Visit: Payer: Self-pay

## 2024-04-08 ENCOUNTER — Other Ambulatory Visit: Payer: Self-pay

## 2024-04-08 DIAGNOSIS — M0579 Rheumatoid arthritis with rheumatoid factor of multiple sites without organ or systems involvement: Secondary | ICD-10-CM | POA: Diagnosis not present

## 2024-04-08 DIAGNOSIS — Z79899 Other long term (current) drug therapy: Secondary | ICD-10-CM | POA: Diagnosis not present

## 2024-04-08 MED ORDER — FOLIC ACID 1 MG PO TABS
1.0000 mg | ORAL_TABLET | Freq: Every day | ORAL | 3 refills | Status: AC
  Filled 2024-04-08 – 2024-04-27 (×2): qty 90, 90d supply, fill #0
  Filled 2024-07-26: qty 90, 90d supply, fill #1

## 2024-04-08 MED ORDER — METHOTREXATE SODIUM 2.5 MG PO TABS
17.5000 mg | ORAL_TABLET | ORAL | 1 refills | Status: DC
  Filled 2024-04-08 – 2024-04-22 (×2): qty 84, 84d supply, fill #0
  Filled 2024-07-11: qty 84, 84d supply, fill #1

## 2024-04-22 ENCOUNTER — Other Ambulatory Visit: Payer: Self-pay

## 2024-04-26 ENCOUNTER — Other Ambulatory Visit: Payer: Self-pay

## 2024-04-27 ENCOUNTER — Other Ambulatory Visit: Payer: Self-pay

## 2024-05-06 ENCOUNTER — Other Ambulatory Visit: Payer: Self-pay

## 2024-05-09 ENCOUNTER — Other Ambulatory Visit: Payer: Self-pay

## 2024-05-17 ENCOUNTER — Other Ambulatory Visit: Payer: Self-pay

## 2024-05-17 MED ORDER — PREDNISONE 10 MG PO TABS
ORAL_TABLET | ORAL | 0 refills | Status: DC
  Filled 2024-05-17: qty 20, 8d supply, fill #0

## 2024-05-23 ENCOUNTER — Other Ambulatory Visit: Payer: Self-pay

## 2024-06-01 ENCOUNTER — Other Ambulatory Visit: Payer: Self-pay

## 2024-06-01 DIAGNOSIS — F339 Major depressive disorder, recurrent, unspecified: Secondary | ICD-10-CM | POA: Diagnosis not present

## 2024-06-01 DIAGNOSIS — G4733 Obstructive sleep apnea (adult) (pediatric): Secondary | ICD-10-CM | POA: Diagnosis not present

## 2024-06-01 DIAGNOSIS — E782 Mixed hyperlipidemia: Secondary | ICD-10-CM | POA: Diagnosis not present

## 2024-06-01 DIAGNOSIS — Z23 Encounter for immunization: Secondary | ICD-10-CM | POA: Diagnosis not present

## 2024-06-01 DIAGNOSIS — K219 Gastro-esophageal reflux disease without esophagitis: Secondary | ICD-10-CM | POA: Diagnosis not present

## 2024-06-01 DIAGNOSIS — Z1331 Encounter for screening for depression: Secondary | ICD-10-CM | POA: Diagnosis not present

## 2024-06-01 DIAGNOSIS — M0579 Rheumatoid arthritis with rheumatoid factor of multiple sites without organ or systems involvement: Secondary | ICD-10-CM | POA: Diagnosis not present

## 2024-06-01 DIAGNOSIS — Z0000 Encounter for general adult medical examination without abnormal findings: Secondary | ICD-10-CM | POA: Diagnosis not present

## 2024-06-01 DIAGNOSIS — Z780 Asymptomatic menopausal state: Secondary | ICD-10-CM | POA: Diagnosis not present

## 2024-06-01 DIAGNOSIS — R7303 Prediabetes: Secondary | ICD-10-CM | POA: Diagnosis not present

## 2024-06-01 DIAGNOSIS — F3341 Major depressive disorder, recurrent, in partial remission: Secondary | ICD-10-CM | POA: Diagnosis not present

## 2024-06-01 MED ORDER — DESVENLAFAXINE SUCCINATE ER 100 MG PO TB24
100.0000 mg | ORAL_TABLET | Freq: Every day | ORAL | 1 refills | Status: AC
  Filled 2024-06-01: qty 90, 90d supply, fill #0
  Filled 2024-09-26: qty 90, 90d supply, fill #1

## 2024-06-01 MED ORDER — SERTRALINE HCL 50 MG PO TABS
50.0000 mg | ORAL_TABLET | Freq: Every day | ORAL | 11 refills | Status: AC
  Filled 2024-06-01: qty 30, 30d supply, fill #0
  Filled 2024-07-26: qty 30, 30d supply, fill #1
  Filled 2024-08-27: qty 30, 30d supply, fill #2
  Filled 2024-09-26: qty 30, 30d supply, fill #3

## 2024-06-15 ENCOUNTER — Other Ambulatory Visit: Payer: Self-pay

## 2024-06-15 MED ORDER — OMEPRAZOLE 20 MG PO CPDR
20.0000 mg | DELAYED_RELEASE_CAPSULE | Freq: Every day | ORAL | 1 refills | Status: AC
  Filled 2024-06-15: qty 90, 90d supply, fill #0
  Filled 2024-09-21: qty 90, 90d supply, fill #1

## 2024-07-04 ENCOUNTER — Other Ambulatory Visit: Payer: Self-pay

## 2024-07-04 MED ORDER — ROSUVASTATIN CALCIUM 5 MG PO TABS
5.0000 mg | ORAL_TABLET | Freq: Every day | ORAL | 3 refills | Status: AC
  Filled 2024-07-04: qty 90, 90d supply, fill #0
  Filled 2024-09-26: qty 90, 90d supply, fill #1

## 2024-07-14 ENCOUNTER — Other Ambulatory Visit: Payer: Self-pay

## 2024-07-14 DIAGNOSIS — J209 Acute bronchitis, unspecified: Secondary | ICD-10-CM | POA: Diagnosis not present

## 2024-07-14 DIAGNOSIS — F3341 Major depressive disorder, recurrent, in partial remission: Secondary | ICD-10-CM | POA: Diagnosis not present

## 2024-07-14 DIAGNOSIS — F339 Major depressive disorder, recurrent, unspecified: Secondary | ICD-10-CM | POA: Diagnosis not present

## 2024-07-14 DIAGNOSIS — G4733 Obstructive sleep apnea (adult) (pediatric): Secondary | ICD-10-CM | POA: Diagnosis not present

## 2024-07-14 DIAGNOSIS — E782 Mixed hyperlipidemia: Secondary | ICD-10-CM | POA: Diagnosis not present

## 2024-07-14 DIAGNOSIS — M0579 Rheumatoid arthritis with rheumatoid factor of multiple sites without organ or systems involvement: Secondary | ICD-10-CM | POA: Diagnosis not present

## 2024-07-14 MED ORDER — BENZONATATE 200 MG PO CAPS
200.0000 mg | ORAL_CAPSULE | Freq: Three times a day (TID) | ORAL | 0 refills | Status: AC
  Filled 2024-07-14: qty 20, 7d supply, fill #0

## 2024-07-14 MED ORDER — AMOXICILLIN-POT CLAVULANATE 875-125 MG PO TABS
1.0000 | ORAL_TABLET | Freq: Two times a day (BID) | ORAL | 0 refills | Status: AC
  Filled 2024-07-14: qty 20, 10d supply, fill #0

## 2024-07-14 MED ORDER — PREDNISONE 20 MG PO TABS
ORAL_TABLET | ORAL | 0 refills | Status: AC
  Filled 2024-07-14: qty 15, 10d supply, fill #0

## 2024-09-27 ENCOUNTER — Other Ambulatory Visit: Payer: Self-pay

## 2024-10-06 ENCOUNTER — Other Ambulatory Visit: Payer: Self-pay

## 2024-10-06 MED ORDER — METHOTREXATE SODIUM 2.5 MG PO TABS
17.5000 mg | ORAL_TABLET | ORAL | 1 refills | Status: AC
  Filled 2024-10-06: qty 84, 84d supply, fill #0
